# Patient Record
Sex: Female | Born: 1955 | Race: White | Hispanic: No | Marital: Married | Smoking: Never smoker
Health system: Southern US, Community
[De-identification: ages and names within clinical notes are randomized; demographics above are authoritative.]

## PROBLEM LIST (undated history)

## (undated) ENCOUNTER — Emergency Department (HOSPITAL_COMMUNITY): Admission: EM | Payer: Self-pay | Source: Home / Self Care

## (undated) DIAGNOSIS — Z8719 Personal history of other diseases of the digestive system: Secondary | ICD-10-CM

## (undated) DIAGNOSIS — E7800 Pure hypercholesterolemia, unspecified: Secondary | ICD-10-CM

## (undated) DIAGNOSIS — M1990 Unspecified osteoarthritis, unspecified site: Secondary | ICD-10-CM

## (undated) DIAGNOSIS — Z8711 Personal history of peptic ulcer disease: Secondary | ICD-10-CM

## (undated) DIAGNOSIS — I10 Essential (primary) hypertension: Secondary | ICD-10-CM

## (undated) HISTORY — DX: Personal history of peptic ulcer disease: Z87.11

## (undated) HISTORY — DX: Personal history of other diseases of the digestive system: Z87.19

## (undated) HISTORY — DX: Unspecified osteoarthritis, unspecified site: M19.90

---

## 2010-02-20 ENCOUNTER — Emergency Department (HOSPITAL_COMMUNITY): Admission: EM | Admit: 2010-02-20 | Discharge: 2010-02-20 | Payer: Self-pay | Admitting: Emergency Medicine

## 2010-03-14 ENCOUNTER — Emergency Department (HOSPITAL_COMMUNITY): Admission: EM | Admit: 2010-03-14 | Discharge: 2010-03-14 | Payer: Self-pay | Admitting: Emergency Medicine

## 2012-09-30 ENCOUNTER — Encounter (HOSPITAL_COMMUNITY): Payer: Self-pay | Admitting: Emergency Medicine

## 2012-09-30 ENCOUNTER — Emergency Department (HOSPITAL_COMMUNITY)
Admission: EM | Admit: 2012-09-30 | Discharge: 2012-09-30 | Disposition: A | Discharge status: Home / Self Care | Payer: Self-pay | Attending: Emergency Medicine | Admitting: Emergency Medicine

## 2012-09-30 DIAGNOSIS — K089 Disorder of teeth and supporting structures, unspecified: Secondary | ICD-10-CM | POA: Insufficient documentation

## 2012-09-30 DIAGNOSIS — I10 Essential (primary) hypertension: Secondary | ICD-10-CM | POA: Insufficient documentation

## 2012-09-30 DIAGNOSIS — K029 Dental caries, unspecified: Secondary | ICD-10-CM | POA: Insufficient documentation

## 2012-09-30 DIAGNOSIS — E7800 Pure hypercholesterolemia, unspecified: Secondary | ICD-10-CM | POA: Insufficient documentation

## 2012-09-30 DIAGNOSIS — K0889 Other specified disorders of teeth and supporting structures: Secondary | ICD-10-CM

## 2012-09-30 HISTORY — DX: Essential (primary) hypertension: I10

## 2012-09-30 HISTORY — DX: Pure hypercholesterolemia, unspecified: E78.00

## 2012-09-30 MED ORDER — CLINDAMYCIN HCL 150 MG PO CAPS
300.0000 mg | ORAL_CAPSULE | Freq: Once | ORAL | Status: AC
  Administered 2012-09-30: 300 mg via ORAL
  Filled 2012-09-30: qty 2

## 2012-09-30 MED ORDER — PENICILLIN V POTASSIUM 500 MG PO TABS: 500.0000 mg | ORAL_TABLET | Freq: Four times a day (QID) | ORAL | Status: AC

## 2012-09-30 NOTE — ED Notes (Signed)
Pt c/o dental pain x one month.

## 2012-09-30 NOTE — ED Provider Notes (Signed)
History     CSN: [id number]  Arrival date & time 09/30/12  0902   First MD Initiated Contact with Patient 09/30/12 [phone]      Chief Complaint  Patient presents with  . Dental Pain    (Consider location/radiation/quality/duration/timing/severity/associated sxs/prior treatment) Patient is a 56 y.o. female presenting with tooth pain. The history is provided by the patient.  Dental PainThe primary symptoms include mouth pain. Primary symptoms do not include dental injury, oral bleeding, headaches, fever, shortness of breath, sore throat, angioedema or cough. The symptoms began more than 1 week ago. The symptoms are worsening. The symptoms are chronic. The symptoms occur constantly.  Affected locations include: teeth and gum(s).  Additional symptoms include: dental sensitivity to temperature and gum tenderness. Additional symptoms do not include: gum swelling, purulent gums, trismus, facial swelling, trouble swallowing, pain with swallowing, drooling and ear pain. Medical issues include: periodontal disease. Medical issues do not include: smoking.    Past Medical History  Diagnosis Date  . Hypertension   . Hypercholesteremia     History reviewed. No pertinent past surgical history.  History reviewed. No pertinent family history.  History  Substance Use Topics  . Smoking status: Not on file  . Smokeless tobacco: Not on file  . Alcohol Use: No    OB History    Grav Para Term Preterm Abortions TAB SAB Ect Mult Living                  Review of Systems  Constitutional: Negative for fever and appetite change.  HENT: Positive for dental problem. Negative for ear pain, congestion, sore throat, facial swelling, drooling, trouble swallowing, neck pain and neck stiffness.   Eyes: Negative for pain and visual disturbance.  Respiratory: Negative for cough and shortness of breath.   Neurological: Negative for dizziness, facial asymmetry and headaches.  Hematological: Negative for  adenopathy.  All other systems reviewed and are negative.    Allergies  Review of patient's allergies indicates no known allergies.  Home Medications  No current outpatient prescriptions on file.  BP 128/82  Pulse 99  Temp 98.2 F (36.8 C) (Oral)  Resp 18  Ht 5\' 4"  (1.626 m)  Wt 158 lb (71.668 kg)  BMI 27.12 kg/m2  SpO2 95%  Physical Exam  Nursing note and vitals reviewed. Constitutional: She is oriented to person, place, and time. She appears well-developed and well-nourished. No distress.  HENT:  Head: Normocephalic and atraumatic. No trismus in the jaw.  Right Ear: Tympanic membrane and ear canal normal.  Left Ear: Tympanic membrane and ear canal normal.  Mouth/Throat: Uvula is midline, oropharynx is clear and moist and mucous membranes are normal. Dental caries present. No dental abscesses or uvula swelling.       Multiple dental caries and periodontal disease.  No trismus, facial edema or obvious abscess  Neck: Normal range of motion. Neck supple.  Cardiovascular: Normal rate, regular rhythm and normal heart sounds.   No murmur heard. Pulmonary/Chest: Effort normal and breath sounds normal.  Musculoskeletal: Normal range of motion.  Lymphadenopathy:    She has no cervical adenopathy.  Neurological: She is alert and oriented to person, place, and time. She exhibits normal muscle tone. Coordination normal.  Skin: Skin is warm and dry.    ED Course  Procedures (including critical care time)  Labs Reviewed - No data to display No results found.      MDM   Multiple dental caries and widespread dental disease.  No facial  edema or trismus.      Pt agrees to f/u with dentist.  Prescribed: Pen VK   Ellayna Hilligoss L. Emberlee Sortino, [place] 10/02/12 1544

## 2012-10-02 NOTE — ED Provider Notes (Signed)
Medical screening examination/treatment/procedure(s) were performed by non-physician practitioner and as supervising physician I was immediately available for consultation/collaboration.  Laurann Mcmorris, MD 10/02/12 1549 

## 2013-12-13 ENCOUNTER — Encounter (HOSPITAL_COMMUNITY): Payer: Self-pay | Admitting: Emergency Medicine

## 2013-12-13 ENCOUNTER — Emergency Department (HOSPITAL_COMMUNITY)
Admission: EM | Admit: 2013-12-13 | Discharge: 2013-12-13 | Disposition: A | Discharge status: Home / Self Care | Payer: Self-pay | Attending: Emergency Medicine | Admitting: Emergency Medicine

## 2013-12-13 DIAGNOSIS — I10 Essential (primary) hypertension: Secondary | ICD-10-CM | POA: Insufficient documentation

## 2013-12-13 DIAGNOSIS — Z862 Personal history of diseases of the blood and blood-forming organs and certain disorders involving the immune mechanism: Secondary | ICD-10-CM | POA: Insufficient documentation

## 2013-12-13 DIAGNOSIS — K047 Periapical abscess without sinus: Secondary | ICD-10-CM

## 2013-12-13 DIAGNOSIS — K029 Dental caries, unspecified: Secondary | ICD-10-CM

## 2013-12-13 DIAGNOSIS — Z8639 Personal history of other endocrine, nutritional and metabolic disease: Secondary | ICD-10-CM | POA: Insufficient documentation

## 2013-12-13 MED ORDER — AMOXICILLIN 500 MG PO CAPS: 500.0000 mg | ORAL_CAPSULE | Freq: Three times a day (TID) | ORAL | Status: AC

## 2013-12-13 NOTE — ED Provider Notes (Signed)
CSN: [id number]     Arrival date & time 12/13/13  1243 History   First MD Initiated Contact with Patient 12/13/13 1246     Chief Complaint  Patient presents with  . Dental Pain   (Consider location/radiation/quality/duration/timing/severity/associated sxs/prior Treatment) HPI Comments: Jessica Russo is a 58 y.o. female presenting with a several day history of dental pain and gingival swelling.   The patient has a history of decay in several of her left lower molar teeth which has recently started to cause increased pain and now swelling along the outer gingiva.  There has been no fevers,  Chills, nausea or vomiting, also no complaint of difficulty swallowing,  Although chewing makes pain worse.  The patient has tried tylenol and motrin without relief of symptoms.  She is actively waiting for an appointment at [facility] for definitive tx of her dental problems,  Registered 4 weeks ago,  Was told there is a 12-16 week wait time to be seen.    The history is provided by the patient.    Past Medical History  Diagnosis Date  . Hypertension   . Hypercholesteremia    History reviewed. No pertinent past surgical history. History reviewed. No pertinent family history. History  Substance Use Topics  . Smoking status: Never Smoker   . Smokeless tobacco: Not on file  . Alcohol Use: No   OB History   Grav Para Term Preterm Abortions TAB SAB Ect Mult Living                 Review of Systems  Constitutional: Negative for fever.  HENT: Positive for dental problem. Negative for facial swelling and sore throat.   Respiratory: Negative for shortness of breath.   Musculoskeletal: Negative for neck pain and neck stiffness.    Allergies  Review of patient's allergies indicates no known allergies.  Home Medications   Current Outpatient Rx  Name  Route  Sig  Dispense  Refill  . amoxicillin (AMOXIL) 500 MG capsule   Oral   Take 1 capsule (500 mg total) by mouth 3 (three) times  daily.   30 capsule   0    BP 100/78  Pulse 94  Temp(Src) 98.2 F (36.8 C) (Oral)  Resp 20  Ht 5\' 4"  (1.626 m)  Wt 150 lb (68.04 kg)  BMI 25.73 kg/m2  SpO2 98% Physical Exam  Constitutional: She is oriented to person, place, and time. She appears well-developed and well-nourished. No distress.  HENT:  Head: Normocephalic and atraumatic.  Right Ear: Tympanic membrane and external ear normal.  Left Ear: Tympanic membrane and external ear normal.  Mouth/Throat: Oropharynx is clear and moist and mucous membranes are normal. No oral lesions. No trismus in the jaw. Dental abscesses present.    Deep cavities in #18 and #20.  Erythema along outer gingival along the left mandible.  No fluctuance, no drainage, no facial edema or erythema.  Eyes: Conjunctivae are normal.  Neck: Normal range of motion. Neck supple.  Cardiovascular: Normal rate and normal heart sounds.   Pulmonary/Chest: Effort normal.  Abdominal: She exhibits no distension.  Musculoskeletal: Normal range of motion.  Lymphadenopathy:    She has no cervical adenopathy.  Neurological: She is alert and oriented to person, place, and time.  Skin: Skin is warm and dry. No erythema.  Psychiatric: She has a normal mood and affect.    ED Course  Procedures (including critical care time) Labs Review Labs Reviewed - No data to display  Imaging Review No results found.  EKG Interpretation   None       MDM   1. Dental abscess   2. Dental decay    Amoxil prescribed.  Suggested dental [facility] which she is planning [facility] at [facility]  In the interim,  Encouraged recheck by pcp [facility]) for any worsened sx.    Burgess AmorJulie Thu Baggett, PA-C 12/13/13 1438

## 2013-12-13 NOTE — Discharge Instructions (Signed)
°  Dental Abscess °A dental abscess is a collection of infected fluid (pus) from a bacterial infection in the inner part of the tooth (pulp). It usually occurs at the end of the tooth's root.  °CAUSES  °· Severe tooth decay. °· Trauma to the tooth that allows bacteria to enter into the pulp, such as a broken or chipped tooth. °SYMPTOMS  °· Severe pain in and around the infected tooth. °· Swelling and redness around the abscessed tooth or in the mouth or face. °· Tenderness. °· Pus drainage. °· Bad breath. °· Bitter taste in the mouth. °· Difficulty swallowing. °· Difficulty opening the mouth. °· Nausea. °· Vomiting. °· Chills. °· Swollen neck glands. °DIAGNOSIS  °· A medical and dental history will be taken. °· An examination will be performed by tapping on the abscessed tooth. °· X-rays may be taken of the tooth to identify the abscess. °TREATMENT °The goal of treatment is to eliminate the infection. You may be prescribed antibiotic medicine to stop the infection from spreading. A root canal may be performed to save the tooth. If the tooth cannot be saved, it may be pulled (extracted) and the abscess may be drained.  °HOME CARE INSTRUCTIONS °· Only take over-the-counter or prescription medicines for pain, fever, or discomfort as directed by your caregiver. °· Rinse your mouth (gargle) often with salt water (¼ tsp salt in 8 oz [250 ml] of warm water) to relieve pain or swelling. °· Do not drive after taking pain medicine (narcotics). °· Do not apply heat to the outside of your face. °· Return to your dentist for further treatment as directed. °SEEK MEDICAL CARE IF: °· Your pain is not helped by medicine. °· Your pain is getting worse instead of better. °SEEK IMMEDIATE MEDICAL CARE IF: °· You have a fever or persistent symptoms for more than 2 3 days. °· You have a fever and your symptoms suddenly get worse. °· You have chills or a very bad headache. °· You have problems breathing or swallowing. °· You have trouble  opening your mouth. °· You have swelling in the neck or around the eye. °Document Released: 10/31/2005 Document Revised: 07/25/2012 Document Reviewed: 02/08/2011 °Exit[employer]® Patient Information ©2014 [employer]. ° ° °

## 2013-12-13 NOTE — ED Provider Notes (Signed)
Medical screening examination/treatment/procedure(s) were performed by non-physician practitioner and as supervising physician I was immediately available for consultation/collaboration.     Garo Heidelberg, MD 12/13/13 1446 

## 2013-12-13 NOTE — ED Notes (Signed)
Dental pain lt lower jaw.

## 2014-10-23 ENCOUNTER — Encounter (HOSPITAL_COMMUNITY): Payer: Self-pay | Admitting: Emergency Medicine

## 2014-10-23 ENCOUNTER — Emergency Department (HOSPITAL_COMMUNITY): Payer: Self-pay

## 2014-10-23 ENCOUNTER — Emergency Department (HOSPITAL_COMMUNITY)
Admission: EM | Admit: 2014-10-23 | Discharge: 2014-10-23 | Disposition: A | Discharge status: Home / Self Care | Payer: Self-pay | Attending: Emergency Medicine | Admitting: Emergency Medicine

## 2014-10-23 DIAGNOSIS — W1839XA Other fall on same level, initial encounter: Secondary | ICD-10-CM | POA: Insufficient documentation

## 2014-10-23 DIAGNOSIS — S93401A Sprain of unspecified ligament of right ankle, initial encounter: Secondary | ICD-10-CM | POA: Insufficient documentation

## 2014-10-23 DIAGNOSIS — R609 Edema, unspecified: Secondary | ICD-10-CM

## 2014-10-23 DIAGNOSIS — Y939 Activity, unspecified: Secondary | ICD-10-CM | POA: Insufficient documentation

## 2014-10-23 DIAGNOSIS — Z8639 Personal history of other endocrine, nutritional and metabolic disease: Secondary | ICD-10-CM | POA: Insufficient documentation

## 2014-10-23 DIAGNOSIS — I10 Essential (primary) hypertension: Secondary | ICD-10-CM | POA: Insufficient documentation

## 2014-10-23 DIAGNOSIS — Y998 Other external cause status: Secondary | ICD-10-CM | POA: Insufficient documentation

## 2014-10-23 DIAGNOSIS — Y929 Unspecified place or not applicable: Secondary | ICD-10-CM | POA: Insufficient documentation

## 2014-10-23 NOTE — Care Management Note (Signed)
ED/CM noted patient did not have health insurance and/or PCP listed in the computer.  Patient was given the [place] resource handout with information on the clinics, food pantries, and the handout for new health insurance sign-up. Pt was also given a Rx discount card. Patient expressed appreciation for information received. 

## 2014-10-23 NOTE — Discharge Instructions (Signed)

## 2014-10-23 NOTE — ED Notes (Signed)
Pt c/o right ankle pain after falling x 2 days ago. Swelling/bruising noted.

## 2014-10-23 NOTE — ED Provider Notes (Signed)
CSN: [id number]     Arrival date & time 10/23/14  1158 History   First MD Initiated Contact with Patient 10/23/14 1309     Chief Complaint  Patient presents with  . Ankle Pain     (Consider location/radiation/quality/duration/timing/severity/associated sxs/prior Treatment) Patient is a 58 y.o. female presenting with ankle pain. The history is provided by the patient.  Ankle Pain Location:  Ankle Time since incident:  2 days Ankle location:  R ankle Pain details:    Quality:  Aching   Severity:  Moderate   Onset quality:  Sudden   Duration:  2 days   Timing:  Intermittent   Progression:  Worsening Chronicity:  New Dislocation: no   Relieved by:  Nothing Worsened by:  Bearing weight Associated symptoms: decreased ROM and swelling   Associated symptoms: no back pain, no neck pain and no numbness   Risk factors: no frequent fractures and no recent illness     Past Medical History  Diagnosis Date  . Hypertension   . Hypercholesteremia    History reviewed. No pertinent past surgical history. No family history on file. History  Substance Use Topics  . Smoking status: Never Smoker   . Smokeless tobacco: Not on file  . Alcohol Use: No   OB History    No data available     Review of Systems  Constitutional: Negative for activity change.       All ROS Neg except as noted in HPI  HENT: Negative for nosebleeds.   Eyes: Negative for photophobia and discharge.  Respiratory: Negative for cough, shortness of breath and wheezing.   Cardiovascular: Negative for chest pain and palpitations.  Gastrointestinal: Negative for abdominal pain and blood in stool.  Genitourinary: Negative for dysuria, frequency and hematuria.  Musculoskeletal: Negative for back pain, arthralgias and neck pain.  Skin: Negative.   Neurological: Negative for dizziness, seizures and speech difficulty.  Psychiatric/Behavioral: Negative for hallucinations and confusion.      Allergies  Review of  patient's allergies indicates no known allergies.  Home Medications   Prior to Admission medications   Not on File   BP 123/76 mmHg  Pulse 84  Temp(Src) 98.7 F (37.1 C)  Resp 18  Ht 5\' 4"  (1.626 m)  Wt 150 lb (68.04 kg)  BMI 25.73 kg/m2  SpO2 98% Physical Exam  Constitutional: She is oriented to person, place, and time. She appears well-developed and well-nourished.  Non-toxic appearance.  HENT:  Head: Normocephalic.  Right Ear: Tympanic membrane and external ear normal.  Left Ear: Tympanic membrane and external ear normal.  Eyes: EOM and lids are normal. Pupils are equal, round, and reactive to light.  Neck: Normal range of motion. Neck supple. Carotid bruit is not present.  Cardiovascular: Normal rate, regular rhythm, normal heart sounds, intact distal pulses and normal pulses.   Pulmonary/Chest: Breath sounds normal. No respiratory distress.  Abdominal: Soft. Bowel sounds are normal. There is no tenderness. There is no guarding.  Musculoskeletal: Normal range of motion.  Is full range of motion of the toes of the right lower extremity. There is a hematoma noted of the lateral malleolus. Dorsalis pedis and posterior tibial pulses are 2+. Is good range of motion of the right knee and hip.  Lymphadenopathy:       Head (right side): No submandibular adenopathy present.       Head (left side): No submandibular adenopathy present.    She has no cervical adenopathy.  Neurological: She is alert and  oriented to person, place, and time. She has normal strength. No cranial nerve deficit or sensory deficit.  Skin: Skin is warm and dry.  Psychiatric: She has a normal mood and affect. Her speech is normal.  Nursing note and vitals reviewed.   ED Course  Procedures (including critical care time) Labs Review Labs Reviewed - No data to display  Imaging Review Dg Ankle Complete Right  10/23/2014   CLINICAL DATA:  Right ankle pain.  EXAM: RIGHT ANKLE - COMPLETE 3+ VIEW  COMPARISON:   None.  FINDINGS: Mild soft tissue swelling. No acute bony or joint abnormality. No evidence fracture or dislocation.  IMPRESSION: Mild soft tissue swelling.  No acute abnormality.   Electronically Signed   By: Maisie Fushomas  Register   On: 10/23/2014 13:31     EKG Interpretation None      MDM  Vital signs are well within normal limits. X-ray of the right ankle show soft tissue swelling, but no fracture or dislocation. The patient is fitted with an Aircast splint. The patient is to follow-up with orthopedics if not improving.    Final diagnoses:  Swelling    *I have reviewed nursing notes, vital signs, and all appropriate lab and imaging results for this patient.[address]    Dorlisa Savino M Bradan Congrove, PA-C 10/23/14 1353  Donnetta HutchingBrian Cook, MD 10/25/14 [phone]

## 2015-01-10 ENCOUNTER — Emergency Department (HOSPITAL_COMMUNITY)
Admission: EM | Admit: 2015-01-10 | Discharge: 2015-01-11 | Disposition: A | Discharge status: Home / Self Care | Payer: Self-pay | Attending: Emergency Medicine | Admitting: Emergency Medicine

## 2015-01-10 ENCOUNTER — Encounter (HOSPITAL_COMMUNITY): Payer: Self-pay | Admitting: *Deleted

## 2015-01-10 ENCOUNTER — Emergency Department (HOSPITAL_COMMUNITY): Payer: Self-pay

## 2015-01-10 DIAGNOSIS — Y9289 Other specified places as the place of occurrence of the external cause: Secondary | ICD-10-CM | POA: Insufficient documentation

## 2015-01-10 DIAGNOSIS — E780 Pure hypercholesterolemia: Secondary | ICD-10-CM | POA: Insufficient documentation

## 2015-01-10 DIAGNOSIS — W01198A Fall on same level from slipping, tripping and stumbling with subsequent striking against other object, initial encounter: Secondary | ICD-10-CM | POA: Insufficient documentation

## 2015-01-10 DIAGNOSIS — Y998 Other external cause status: Secondary | ICD-10-CM | POA: Insufficient documentation

## 2015-01-10 DIAGNOSIS — I10 Essential (primary) hypertension: Secondary | ICD-10-CM | POA: Insufficient documentation

## 2015-01-10 DIAGNOSIS — Y9301 Activity, walking, marching and hiking: Secondary | ICD-10-CM | POA: Insufficient documentation

## 2015-01-10 DIAGNOSIS — S0012XA Contusion of left eyelid and periocular area, initial encounter: Secondary | ICD-10-CM | POA: Insufficient documentation

## 2015-01-10 DIAGNOSIS — W010XXA Fall on same level from slipping, tripping and stumbling without subsequent striking against object, initial encounter: Secondary | ICD-10-CM

## 2015-01-10 DIAGNOSIS — S0083XA Contusion of other part of head, initial encounter: Secondary | ICD-10-CM

## 2015-01-10 MED ORDER — IBUPROFEN 800 MG PO TABS
800.0000 mg | ORAL_TABLET | Freq: Once | ORAL | Status: AC
  Administered 2015-01-11: 800 mg via ORAL
  Filled 2015-01-10: qty 1

## 2015-01-10 NOTE — ED Notes (Signed)
Larey SeatFell over railroad tie hitting L eye on edge of box and railroad tie.

## 2015-01-10 NOTE — ED Provider Notes (Signed)
CSN: [id number]     Arrival date & time 01/10/15  2144 [id number]  This chart was scribed for Dione Boozeavid Wessie Shanks, MD by [place] Peace, ED Scribe. The patient was seen in APA14/AP[facility]14. The patient's care was started at 11:00 PM.    Chief Complaint  Patient presents with  . Fall  . Eye Pain      Patient is a 59 y.o. female presenting with fall and eye pain. The history is provided by the patient. No language interpreter was used.  Fall This is a new problem. The current episode started 1 to 2 hours ago.  Eye Pain    HPI Comments: Jessica Russo is a 59 y.o. female who presents to the Emergency Department complaining of fall that occurred earlier tonight while pt was walking to her car. Pt states that Jessica Russo was carrying a box in her hands when Jessica Russo tripped and hit her face on the corner of the box. Pt now complains of left eye pain with bruising noted. Pt has applied ice to hematoma that Jessica Russo states was formed upon onset of incident which has help reduce swelling. No complaints of LOC, nausea, weakness, numbness, or dizziness.    Past Medical History  Diagnosis Date  . Hypertension   . Hypercholesteremia    History reviewed. No pertinent past surgical history. History reviewed. No pertinent family history. History  Substance Use Topics  . Smoking status: Never Smoker   . Smokeless tobacco: Not on file  . Alcohol Use: No   OB History    No data available     Review of Systems  Eyes: Positive for pain.  Gastrointestinal: Negative for nausea.  Neurological: Negative for dizziness, syncope, weakness and numbness.      Allergies  Review of patient's allergies indicates no known allergies.  Home Medications   Prior to Admission medications   Medication Sig Start Date End Date Taking? Authorizing Provider  lisinopril (PRINIVIL,ZESTRIL) 2.5 MG tablet Take 2.5 mg by mouth daily.   Yes Historical Provider, MD  lovastatin (MEVACOR) 10 MG tablet Take 10 mg by mouth at bedtime.   Yes Historical  Provider, MD   BP 145/82 mmHg  Pulse 85  Temp(Src) 98.1 F (36.7 C) (Oral)  Resp 14  Ht 5\' 3"  (1.6 m)  Wt 155 lb (70.308 kg)  BMI 27.46 kg/m2  SpO2 100% Physical Exam  Constitutional: Jessica Russo is oriented to person, place, and time. Jessica Russo appears well-developed and well-nourished.  HENT:  Head: Normocephalic and atraumatic.  Swelling and tenderness in left malar area. Left periorbital ecchymosis.   Eyes: Conjunctivae and EOM are normal. Pupils are equal, round, and reactive to light.  Neck: No JVD present. Erythema: Non-tender.  Cardiovascular: Normal rate, regular rhythm and normal heart sounds.   No murmur heard. Pulmonary/Chest: Effort normal and breath sounds normal. Jessica Russo has no wheezes. Jessica Russo has no rales. Jessica Russo exhibits no tenderness.  Abdominal: Soft. Bowel sounds are normal. Jessica Russo exhibits no distension and no mass. There is no tenderness.  Musculoskeletal: Normal range of motion. Jessica Russo exhibits no edema.  Lymphadenopathy:    Jessica Russo has no cervical adenopathy.  Neurological: Jessica Russo is alert and oriented to person, place, and time. No cranial nerve deficit. Coordination normal.  Skin: Skin is warm and dry. No rash noted.  Psychiatric: Jessica Russo has a normal mood and affect. Her behavior is normal. Judgment and thought content normal.  Nursing note and vitals reviewed.   ED Course  Procedures (including critical care time)  Imaging Review Ct  Cervical Spine Wo Contrast  01/11/2015   CLINICAL DATA:  Fifth fall hitting left side of face onto a box. Sharp pain and bruising to the left eye. Bilateral neck stiffness. Fall from slipped trip or stumble, initial encounter  EXAM: CT MAXILLOFACIAL WITHOUT CONTRAST  CT CERVICAL SPINE WITHOUT CONTRAST  TECHNIQUE: Multidetector CT imaging of the cervical spine and maxillofacial structures were performed using the standard protocol without intravenous contrast. Multiplanar CT image reconstructions of the cervical spine and maxillofacial structures were also  generated.  COMPARISON:  None.  FINDINGS: CT MAXILLOFACIAL FINDINGS  The globes and extraocular muscles appear intact and symmetrical. Normal subcutaneous soft tissue hematoma over the left maxilla inferior to the left eye. The paranasal sinuses are clear. Mastoid air cells are clear. Orbital and nasal bones, facial bones, mandibles, temporomandibular joints, zygomatic arches, and pterygoid plates appear intact. No displaced fractures identified. Multiple tooth extractions. Multiple dental caries. Periapical lucencies at multiple teeth consistent with periodontal disease.  CT CERVICAL SPINE FINDINGS  There is reversal of the usual cervical lordosis with slight anterior subluxation of C3 on C4. This may be due to patient positioning or be related to degenerative change. However, muscle spasm and ligamentous injury can also have this appearance and are not excluded. Diffuse degenerative change throughout the cervical spine with narrowed cervical interspaces and associated endplate hypertrophic changes. Normal alignment of the cervical facet joints with degenerative changes present. C1-2 articulation appears intact with degenerative changes present. No vertebral compression deformities. No prevertebral soft tissue swelling. No focal bone lesion or bone destruction. Bone cortex and trabecular architecture appear intact.  IMPRESSION: No acute displaced orbital or facial fractures identified. Poor dentition.  Degenerative changes in the cervical spine. Nonspecific reversal of the usual cervical lordosis. No acute displaced fractures identified.   Electronically Signed   By: Burman Nieves M.D.   On: 01/11/2015 00:23   Ct Maxillofacial Wo Cm  01/11/2015   CLINICAL DATA:  Fifth fall hitting left side of face onto a box. Sharp pain and bruising to the left eye. Bilateral neck stiffness. Fall from slipped trip or stumble, initial encounter  EXAM: CT MAXILLOFACIAL WITHOUT CONTRAST  CT CERVICAL SPINE WITHOUT CONTRAST   TECHNIQUE: Multidetector CT imaging of the cervical spine and maxillofacial structures were performed using the standard protocol without intravenous contrast. Multiplanar CT image reconstructions of the cervical spine and maxillofacial structures were also generated.  COMPARISON:  None.  FINDINGS: CT MAXILLOFACIAL FINDINGS  The globes and extraocular muscles appear intact and symmetrical. Normal subcutaneous soft tissue hematoma over the left maxilla inferior to the left eye. The paranasal sinuses are clear. Mastoid air cells are clear. Orbital and nasal bones, facial bones, mandibles, temporomandibular joints, zygomatic arches, and pterygoid plates appear intact. No displaced fractures identified. Multiple tooth extractions. Multiple dental caries. Periapical lucencies at multiple teeth consistent with periodontal disease.  CT CERVICAL SPINE FINDINGS  There is reversal of the usual cervical lordosis with slight anterior subluxation of C3 on C4. This may be due to patient positioning or be related to degenerative change. However, muscle spasm and ligamentous injury can also have this appearance and are not excluded. Diffuse degenerative change throughout the cervical spine with narrowed cervical interspaces and associated endplate hypertrophic changes. Normal alignment of the cervical facet joints with degenerative changes present. C1-2 articulation appears intact with degenerative changes present. No vertebral compression deformities. No prevertebral soft tissue swelling. No focal bone lesion or bone destruction. Bone cortex and trabecular architecture appear intact.  IMPRESSION: No  acute displaced orbital or facial fractures identified. Poor dentition.  Degenerative changes in the cervical spine. Nonspecific reversal of the usual cervical lordosis. No acute displaced fractures identified.   Electronically Signed   By: Burman Nieves M.D.   On: 01/11/2015 00:23   Images viewed by me.  Medications  traMADol  (ULTRAM) tablet 50 mg (not administered)  ibuprofen (ADVIL,MOTRIN) tablet 800 mg (800 mg Oral Given 01/11/15 0009)    11:03 PM- Treatment plan was discussed with patient who verbalizes understanding and agrees.   MDM   Final diagnoses:  Fall from slip, trip, or stumble, initial encounter  Facial contusion, initial encounter    Fall with facial injury. No evidence of neurologic injury. Jessica Russo is sent for CT scan of maxillofacial and cervical spine with no acute injury identified. Jessica Russo is discharged with prescription for tramadol.  I personally performed the services described in this documentation, which was scribed in my presence. The recorded information has been reviewed and is accurate.      Dione Booze, MD 01/11/15 [phone]

## 2015-01-11 MED ORDER — TRAMADOL HCL 50 MG PO TABS: 50.0000 mg | ORAL_TABLET | Freq: Once | ORAL | Status: DC

## 2015-01-11 MED ORDER — TRAMADOL HCL 50 MG PO TABS: 50.0000 mg | ORAL_TABLET | Freq: Four times a day (QID) | ORAL | Status: DC | PRN

## 2015-01-11 NOTE — Discharge Instructions (Signed)
Take acetaminophen and/or ibuprofen as needed for less severe pain.  Contusion A contusion is a deep bruise. Contusions are the result of an injury that caused bleeding under the skin. The contusion may turn blue, purple, or yellow. Minor injuries will give you a painless contusion, but more severe contusions may stay painful and swollen for a few weeks.  CAUSES  A contusion is usually caused by a blow, trauma, or direct force to an area of the body. SYMPTOMS   Swelling and redness of the injured area.  Bruising of the injured area.  Tenderness and soreness of the injured area.  Pain. DIAGNOSIS  The diagnosis can be made by taking a history and physical exam. An X-ray, CT scan, or MRI may be needed to determine if there were any associated injuries, such as fractures. TREATMENT  Specific treatment will depend on what area of the body was injured. In general, the best treatment for a contusion is resting, icing, elevating, and applying cold compresses to the injured area. Over-the-counter medicines may also be recommended for pain control. Ask your caregiver what the best treatment is for your contusion. HOME CARE INSTRUCTIONS   Put ice on the injured area.  Put ice in a plastic bag.  Place a towel between your skin and the bag.  Leave the ice on for 15-20 minutes, 3-4 times a day, or as directed by your health care provider.  Only take over-the-counter or prescription medicines for pain, discomfort, or fever as directed by your caregiver. Your caregiver may recommend avoiding anti-inflammatory medicines (aspirin, ibuprofen, and naproxen) for 48 hours because these medicines may increase bruising.  Rest the injured area.  If possible, elevate the injured area to reduce swelling. SEEK IMMEDIATE MEDICAL CARE IF:   You have increased bruising or swelling.  You have pain that is getting worse.  Your swelling or pain is not relieved with medicines. MAKE SURE YOU:   Understand  these instructions.  Will watch your condition.  Will get help right away if you are not doing well or get worse. Document Released: 08/10/2005 Document Revised: 11/05/2013 Document Reviewed: 09/05/2011 [facility] Patient Information 2015 [place], [place]. This information is not intended to replace advice given to you by your health care provider. Make sure you discuss any questions you have with your health care provider.  Tramadol tablets What is this medicine? TRAMADOL (TRA ma dole) is a pain reliever. It is used to treat moderate to severe pain in adults. This medicine may be used for other purposes; ask your health care provider or pharmacist if you have questions. COMMON BRAND NAME(S): Ultram What should I tell my health care provider before I take this medicine? They need to know if you have any of these conditions: -brain tumor -depression -drug abuse or addiction -head injury -if you frequently drink alcohol containing drinks -kidney disease or trouble passing urine -liver disease -lung disease, asthma, or breathing problems -seizures or epilepsy -suicidal thoughts, plans, or attempt; a previous suicide attempt by you or a family member -an unusual or allergic reaction to tramadol, codeine, other medicines, foods, dyes, or preservatives -pregnant or trying to get pregnant -breast-feeding How should I use this medicine? Take this medicine by mouth with a full glass of water. Follow the directions on the prescription label. If the medicine upsets your stomach, take it with food or milk. Do not take more medicine than you are told to take. Talk to your pediatrician regarding the use of this medicine in children.  Special care may be needed. Overdosage: If you think you have taken too much of this medicine contact a poison control center or emergency room at once. NOTE: This medicine is only for you. Do not share this medicine with others. What if I miss a dose? If you miss a  dose, take it as soon as you can. If it is almost time for your next dose, take only that dose. Do not take double or extra doses. What may interact with this medicine? Do not take this medicine with any of the following medications: -MAOIs like Carbex, Eldepryl, Marplan, Nardil, and Parnate This medicine may also interact with the following medications: -alcohol or medicines that contain alcohol -antihistamines -benzodiazepines -bupropion -carbamazepine or oxcarbazepine -clozapine -cyclobenzaprine -digoxin -furazolidone -linezolid -medicines for depression, anxiety, or psychotic disturbances -medicines for migraine headache like almotriptan, eletriptan, frovatriptan, naratriptan, rizatriptan, sumatriptan, zolmitriptan -medicines for pain like pentazocine, buprenorphine, butorphanol, meperidine, nalbuphine, and propoxyphene -medicines for sleep -muscle relaxants -naltrexone -phenobarbital -phenothiazines like perphenazine, thioridazine, chlorpromazine, mesoridazine, fluphenazine, prochlorperazine, promazine, and trifluoperazine -procarbazine -warfarin This list may not describe all possible interactions. Give your health care provider a list of all the medicines, herbs, non-prescription drugs, or dietary supplements you use. Also tell them if you smoke, drink alcohol, or use illegal drugs. Some items may interact with your medicine. What should I watch for while using this medicine? Tell your doctor or health care professional if your pain does not go away, if it gets worse, or if you have new or a different type of pain. You may develop tolerance to the medicine. Tolerance means that you will need a higher dose of the medicine for pain relief. Tolerance is normal and is expected if you take this medicine for a long time. Do not suddenly stop taking your medicine because you may develop a severe reaction. Your body becomes used to the medicine. This does NOT mean you are addicted.  Addiction is a behavior related to getting and using a drug for a non-medical reason. If you have pain, you have a medical reason to take pain medicine. Your doctor will tell you how much medicine to take. If your doctor wants you to stop the medicine, the dose will be slowly lowered over time to avoid any side effects. You may get drowsy or dizzy. Do not drive, use machinery, or do anything that needs mental alertness until you know how this medicine affects you. Do not stand or sit up quickly, especially if you are an older patient. This reduces the risk of dizzy or fainting spells. Alcohol can increase or decrease the effects of this medicine. Avoid alcoholic drinks. You may have constipation. Try to have a bowel movement at least every 2 to 3 days. If you do not have a bowel movement for 3 days, call your doctor or health care professional. Your mouth may get dry. Chewing sugarless gum or sucking hard candy, and drinking plenty of water may help. Contact your doctor if the problem does not go away or is severe. What side effects may I notice from receiving this medicine? Side effects that you should report to your doctor or health care professional as soon as possible: -allergic reactions like skin rash, itching or hives, swelling of the face, lips, or tongue -breathing difficulties, wheezing -confusion -itching -light headedness or fainting spells -redness, blistering, peeling or loosening of the skin, including inside the mouth -seizures Side effects that usually do not require medical attention (report to your doctor or health  care professional if they continue or are bothersome): -constipation -dizziness -drowsiness -headache -nausea, vomiting This list may not describe all possible side effects. Call your doctor for medical advice about side effects. You may report side effects to FDA at 1-800-FDA-1088. Where should I keep my medicine? Keep out of the reach of children. Store at room  temperature between 15 and 30 degrees C (59 and 86 degrees F). Keep container tightly closed. Throw away any unused medicine after the expiration date. NOTE: This sheet is a summary. It may not cover all possible information. If you have questions about this medicine, talk to your doctor, pharmacist, or health care provider.  2015, [employer]vier/[employer] Standard. (2010-07-14 11:55:44)

## 2015-02-06 ENCOUNTER — Emergency Department (HOSPITAL_COMMUNITY): Payer: Self-pay

## 2015-02-06 ENCOUNTER — Emergency Department (HOSPITAL_COMMUNITY)
Admission: EM | Admit: 2015-02-06 | Discharge: 2015-02-06 | Disposition: A | Discharge status: Home / Self Care | Payer: Self-pay | Attending: Emergency Medicine | Admitting: Emergency Medicine

## 2015-02-06 ENCOUNTER — Encounter (HOSPITAL_COMMUNITY): Payer: Self-pay | Admitting: *Deleted

## 2015-02-06 DIAGNOSIS — R109 Unspecified abdominal pain: Secondary | ICD-10-CM

## 2015-02-06 DIAGNOSIS — R1032 Left lower quadrant pain: Secondary | ICD-10-CM | POA: Insufficient documentation

## 2015-02-06 DIAGNOSIS — I10 Essential (primary) hypertension: Secondary | ICD-10-CM | POA: Insufficient documentation

## 2015-02-06 DIAGNOSIS — R52 Pain, unspecified: Secondary | ICD-10-CM

## 2015-02-06 DIAGNOSIS — Z79899 Other long term (current) drug therapy: Secondary | ICD-10-CM | POA: Insufficient documentation

## 2015-02-06 DIAGNOSIS — R0602 Shortness of breath: Secondary | ICD-10-CM | POA: Insufficient documentation

## 2015-02-06 DIAGNOSIS — E780 Pure hypercholesterolemia: Secondary | ICD-10-CM | POA: Insufficient documentation

## 2015-02-06 LAB — URINALYSIS, ROUTINE W REFLEX MICROSCOPIC
Bilirubin Urine: NEGATIVE
Glucose, UA: NEGATIVE mg/dL
Hgb urine dipstick: NEGATIVE
Ketones, ur: NEGATIVE mg/dL
LEUKOCYTES UA: NEGATIVE
Nitrite: NEGATIVE
PROTEIN: NEGATIVE mg/dL
Specific Gravity, Urine: 1.01 (ref 1.005–1.030)
UROBILINOGEN UA: 0.2 mg/dL (ref 0.0–1.0)
pH: 6 (ref 5.0–8.0)

## 2015-02-06 LAB — CBC WITH DIFFERENTIAL/PLATELET
BASOS ABS: 0 10*3/uL (ref 0.0–0.1)
Basophils Relative: 1 % (ref 0–1)
EOS PCT: 3 % (ref 0–5)
Eosinophils Absolute: 0.2 10*3/uL (ref 0.0–0.7)
HEMATOCRIT: 40.3 % (ref 36.0–46.0)
Hemoglobin: 12.8 g/dL (ref 12.0–15.0)
LYMPHS ABS: 1.2 10*3/uL (ref 0.7–4.0)
LYMPHS PCT: 18 % (ref 12–46)
MCH: 25.4 pg — ABNORMAL LOW (ref 26.0–34.0)
MCHC: 31.8 g/dL (ref 30.0–36.0)
MCV: 80.1 fL (ref 78.0–100.0)
MONO ABS: 0.7 10*3/uL (ref 0.1–1.0)
Monocytes Relative: 10 % (ref 3–12)
NEUTROS ABS: 4.6 10*3/uL (ref 1.7–7.7)
Neutrophils Relative %: 68 % (ref 43–77)
Platelets: 332 10*3/uL (ref 150–400)
RBC: 5.03 MIL/uL (ref 3.87–5.11)
RDW: 14.2 % (ref 11.5–15.5)
WBC: 6.6 10*3/uL (ref 4.0–10.5)

## 2015-02-06 LAB — COMPREHENSIVE METABOLIC PANEL
ALBUMIN: 3.9 g/dL (ref 3.5–5.2)
ALT: 23 U/L (ref 0–35)
ANION GAP: 9 (ref 5–15)
AST: 25 U/L (ref 0–37)
Alkaline Phosphatase: 60 U/L (ref 39–117)
BILIRUBIN TOTAL: 0.2 mg/dL — AB (ref 0.3–1.2)
BUN: 11 mg/dL (ref 6–23)
CHLORIDE: 103 mmol/L (ref 96–112)
CO2: 28 mmol/L (ref 19–32)
Calcium: 9.3 mg/dL (ref 8.4–10.5)
Creatinine, Ser: 0.57 mg/dL (ref 0.50–1.10)
GFR calc non Af Amer: >90 mL/min (ref >90)
Glucose, Bld: 96 mg/dL (ref 70–99)
POTASSIUM: 3.8 mmol/L (ref 3.5–5.1)
Sodium: 140 mmol/L (ref 135–145)
TOTAL PROTEIN: 7.2 g/dL (ref 6.0–8.3)

## 2015-02-06 LAB — D-DIMER, QUANTITATIVE (NOT AT ARMC): D DIMER QUANT: 0.77 ug{FEU}/mL — AB (ref 0.00–0.48)

## 2015-02-06 MED ORDER — TRAMADOL HCL 50 MG PO TABS: 50.0000 mg | ORAL_TABLET | Freq: Four times a day (QID) | ORAL | Status: DC | PRN

## 2015-02-06 MED ORDER — IOHEXOL 350 MG/ML SOLN
100.0000 mL | Freq: Once | INTRAVENOUS | Status: AC | PRN
  Administered 2015-02-06: 100 mL via INTRAVENOUS

## 2015-02-06 MED ORDER — IBUPROFEN 800 MG PO TABS
800.0000 mg | ORAL_TABLET | Freq: Once | ORAL | Status: AC
  Administered 2015-02-06: 800 mg via ORAL
  Filled 2015-02-06: qty 1

## 2015-02-06 NOTE — Discharge Instructions (Signed)
Take the ultram if ibuprofen does not help with the discomfort and follow up next week not improving.

## 2015-02-06 NOTE — ED Provider Notes (Signed)
CSN: [id number]     Arrival date & time 02/06/15  1355 History  This chart was scribed for Bethann Berkshire, MD by Ronney Lion, ED Scribe. This patient was seen in room APA01/APA01 and the patient's care was started at 2:30 PM.    Chief Complaint  Patient presents with  . Flank Pain    Patient is a 59 y.o. female presenting with flank pain. The history is provided by the patient and medical records. No language interpreter was used.  Flank Pain This is a new problem. The current episode started more than 2 days ago. The problem occurs constantly. The problem has not changed since onset.Associated symptoms include shortness of breath. Pertinent negatives include no chest pain, no abdominal pain and no headaches. Exacerbated by: movement. Nothing relieves the symptoms. She has tried nothing for the symptoms.    HPI Comments: Jessica Russo is a 59 y.o. female who presents to the Emergency Department complaining of constant, mild left flank pain that started 3 days ago. She also reports mild SOB in the past 3 days. Movement exacerbates the pain. Deep inspiration doesn't affect it. She also states she fell on her face 1 week ago. Per nursing note, patient denies any urinary symptoms.  Past Medical History  Diagnosis Date  . Hypertension   . Hypercholesteremia    History reviewed. No pertinent past surgical history. History reviewed. No pertinent family history. History  Substance Use Topics  . Smoking status: Never Smoker   . Smokeless tobacco: Not on file  . Alcohol Use: No   OB History    No data available     Review of Systems  Constitutional: Negative for appetite change and fatigue.  HENT: Negative for congestion, ear discharge and sinus pressure.   Eyes: Negative for discharge.  Respiratory: Positive for shortness of breath. Negative for cough.   Cardiovascular: Negative for chest pain.  Gastrointestinal: Negative for abdominal pain and diarrhea.  Genitourinary: Positive for flank  pain. Negative for frequency and hematuria.  Musculoskeletal: Negative for back pain.  Skin: Negative for rash.  Neurological: Negative for seizures and headaches.  Psychiatric/Behavioral: Negative for hallucinations.      Allergies  Review of patient's allergies indicates no known allergies.  Home Medications   Prior to Admission medications   Medication Sig Start Date End Date Taking? Authorizing Provider  ibuprofen (ADVIL,MOTRIN) 200 MG tablet Take 400 mg by mouth every 6 (six) hours as needed for moderate pain.   Yes Historical Provider, MD  lisinopril (PRINIVIL,ZESTRIL) 2.5 MG tablet Take 2.5 mg by mouth daily.   Yes Historical Provider, MD  lovastatin (MEVACOR) 10 MG tablet Take 10 mg by mouth at bedtime.   Yes Historical Provider, MD  traMADol (ULTRAM) 50 MG tablet Take 1 tablet (50 mg total) by mouth every 6 (six) hours as needed. Patient not taking: Reported on 02/06/2015 01/11/15   Dione Booze, MD   BP 135/70 mmHg  Pulse 89  Temp(Src) 98.4 F (36.9 C) (Oral)  Resp 18  Ht 5' 3.5" (1.613 m)  Wt 155 lb (70.308 kg)  BMI 27.02 kg/m2  SpO2 99% Physical Exam  Constitutional: She is oriented to person, place, and time. She appears well-developed.  HENT:  Head: Normocephalic.  Eyes: Conjunctivae and EOM are normal. No scleral icterus.  Neck: Neck supple. No thyromegaly present.  Cardiovascular: Normal rate and regular rhythm.  Exam reveals no gallop and no friction rub.   No murmur heard. Pulmonary/Chest: No stridor. She has no wheezes.  She has no rales. She exhibits no tenderness.  Abdominal: She exhibits no distension. There is no tenderness. There is no rebound.  Genitourinary:  Mild left flank tenderness.  Musculoskeletal: Normal range of motion. She exhibits no edema.  Lymphadenopathy:    She has no cervical adenopathy.  Neurological: She is oriented to person, place, and time. She exhibits normal muscle tone. Coordination normal.  Skin: No rash noted. No erythema.   Psychiatric: She has a normal mood and affect. Her behavior is normal.  Nursing note and vitals reviewed.   ED Course  Procedures (including critical care time)  DIAGNOSTIC STUDIES: Oxygen Saturation is 99% on room air, normal by my interpretation.    COORDINATION OF CARE: 2:32 PM - Discussed treatment plan with pt at bedside which includes XR and blood tests, and pt agreed to plan.   Labs Review Labs Reviewed - No data to display  Imaging Review No results found.   EKG Interpretation None      MDM   Final diagnoses:  None    Flank pain from fall,  Nl studies,  tx with ultram and follow up   The chart was scribed for me under my direct supervision.  I personally performed the history, physical, and medical decision making and all procedures in the evaluation of this patient.Bethann Berkshire.     Allure Greaser, MD 02/06/15 [phone]

## 2015-02-06 NOTE — ED Notes (Signed)
EDP at bedside  

## 2015-02-06 NOTE — ED Notes (Signed)
Pain lt flank for 3 days,  No urinary sx.  Vomited  Today,   No injury .  Hurts to move.

## 2015-07-17 IMAGING — CT CT ANGIO CHEST
2 of 6 series · 6 of 36 positions shown · IV contrast (omnipaque)
Comparison: Chest/left rib radiographs dated 02/06/2015

CLINICAL DATA: Left flank pain, shortness of breath x3 days. Status
post fall.

EXAM:
CT ANGIOGRAPHY CHEST WITH CONTRAST
TECHNIQUE: Multidetector CT imaging of the chest was performed using the
standard protocol during bolus administration of intravenous
contrast. Multiplanar CT image reconstructions and MIPs were
obtained to evaluate the vascular anatomy.
CONTRAST:  100mL OMNIPAQUE IOHEXOL 350 MG/ML SOLN

[Series 4: pe 3.0 b40f · axial · 0.61mm/px · z∈[-168,-12]mm · 5 of 80 slices shown]
[im 14/80  lung]
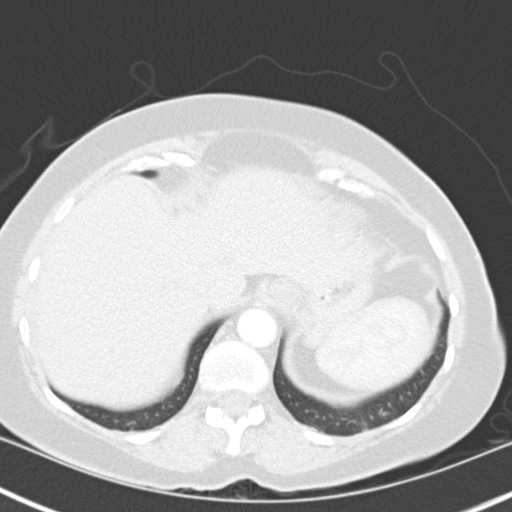
[im 27/80  mediastinal]
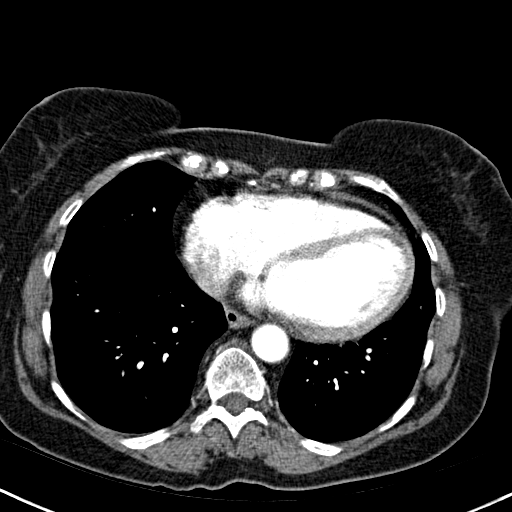
[im 40/80  lung]
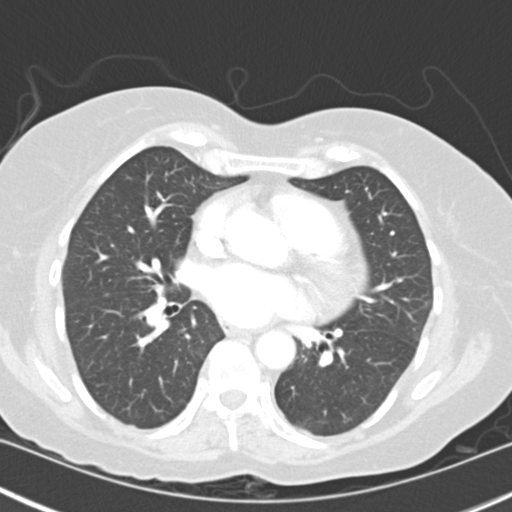
[im 53/80  mediastinal]
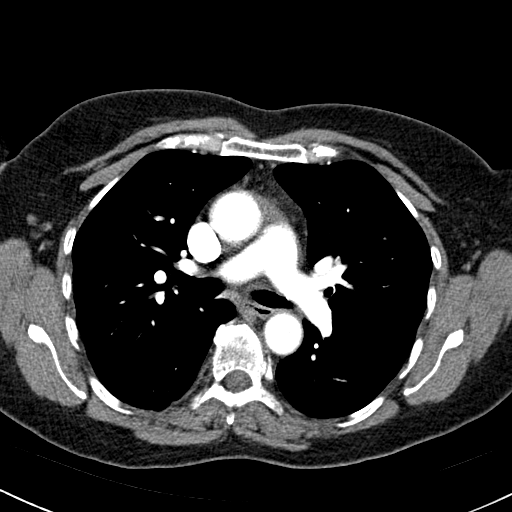
[im 66/80  lung]
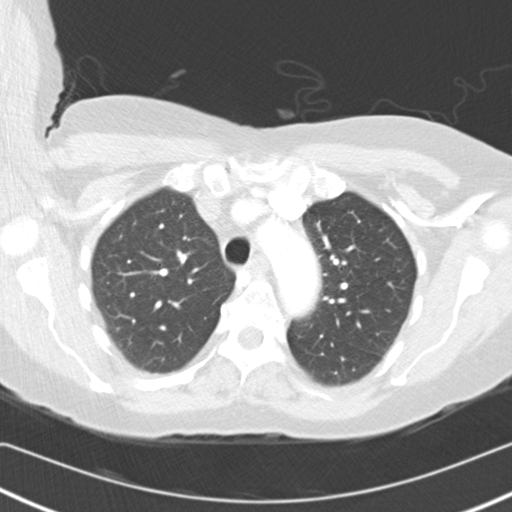

[Series 7: mpr coronal pe 3mm · coronal · 0.47mm/px · 1 of 78 slices shown]
[im 39/78  mediastinal]
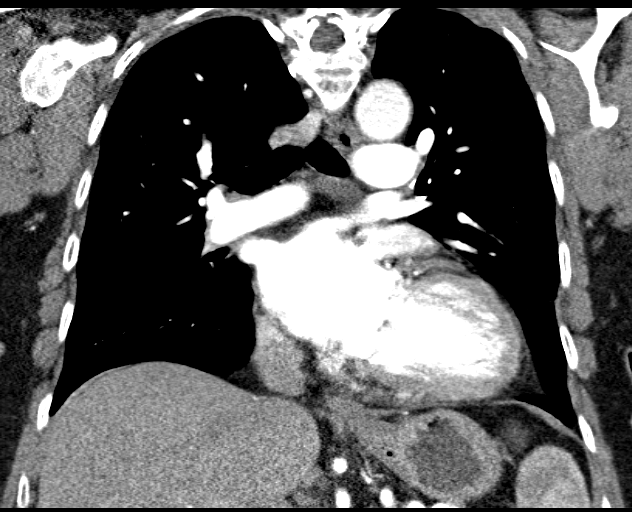

[6 of 36 positions shown; findings below may reference images not displayed]

FINDINGS: No evidence of pulmonary embolism.

Mediastinum/Nodes: Heart is normal in size. No pericardial effusion.

Age advanced coronary atherosclerosis in the LAD and circumflex.

No suspicious mediastinal, hilar, or axillary lymphadenopathy.

Visualized thyroid is unremarkable.

Lungs/Pleura: Subpleural patchy opacity in the right upper lobe and
lingula (series 5/ image 48), likely atelectasis.

No focal consolidation.

No suspicious pulmonary nodules.

No pleural effusion or pneumothorax.

Upper abdomen: Visualized upper abdomen is unremarkable.

Musculoskeletal: Mild degenerative changes of the thoracic spine.

No displaced left rib fracture is seen.

Review of the MIP images confirms the above findings.
IMPRESSION: No evidence of pulmonary embolism.

No evidence of acute cardiopulmonary disease.

No displaced left rib fracture is seen.

Age advanced coronary atherosclerosis.

## 2015-09-20 ENCOUNTER — Encounter (HOSPITAL_COMMUNITY): Payer: Self-pay | Admitting: Emergency Medicine

## 2015-09-20 ENCOUNTER — Emergency Department (HOSPITAL_COMMUNITY)
Admission: EM | Admit: 2015-09-20 | Discharge: 2015-09-20 | Disposition: A | Discharge status: Home / Self Care | Payer: Self-pay | Attending: Emergency Medicine | Admitting: Emergency Medicine

## 2015-09-20 DIAGNOSIS — K047 Periapical abscess without sinus: Secondary | ICD-10-CM

## 2015-09-20 DIAGNOSIS — I10 Essential (primary) hypertension: Secondary | ICD-10-CM | POA: Insufficient documentation

## 2015-09-20 DIAGNOSIS — Z79899 Other long term (current) drug therapy: Secondary | ICD-10-CM | POA: Insufficient documentation

## 2015-09-20 DIAGNOSIS — E7800 Pure hypercholesterolemia, unspecified: Secondary | ICD-10-CM | POA: Insufficient documentation

## 2015-09-20 DIAGNOSIS — K029 Dental caries, unspecified: Secondary | ICD-10-CM | POA: Insufficient documentation

## 2015-09-20 MED ORDER — ACETAMINOPHEN 325 MG PO TABS
650.0000 mg | ORAL_TABLET | Freq: Once | ORAL | Status: AC
  Administered 2015-09-20: 650 mg via ORAL
  Filled 2015-09-20: qty 2

## 2015-09-20 MED ORDER — AMOXICILLIN 250 MG PO CAPS
500.0000 mg | ORAL_CAPSULE | Freq: Once | ORAL | Status: AC
  Administered 2015-09-20: 500 mg via ORAL
  Filled 2015-09-20: qty 2

## 2015-09-20 MED ORDER — IBUPROFEN 600 MG PO TABS: 600.0000 mg | ORAL_TABLET | Freq: Four times a day (QID) | ORAL | Status: DC | PRN

## 2015-09-20 MED ORDER — IBUPROFEN 800 MG PO TABS
800.0000 mg | ORAL_TABLET | Freq: Once | ORAL | Status: DC
  Filled 2015-09-20: qty 1

## 2015-09-20 MED ORDER — AMOXICILLIN 500 MG PO CAPS: 500.0000 mg | ORAL_CAPSULE | Freq: Three times a day (TID) | ORAL | Status: DC

## 2015-09-20 NOTE — ED Notes (Signed)
Pt c/o multiple dental abscesses.

## 2015-09-20 NOTE — Discharge Instructions (Signed)
Please use Amoxil and ibuprofen daily. Please take these medicines with food. Please see your dentist as sone as possible concerning these multiple dental issues. Dental Caries Dental caries is tooth decay. This decay can cause a hole in teeth (cavity) that can get bigger and deeper over time. HOME CARE  Brush and floss your teeth. Do this at least two times a day.  Use a fluoride toothpaste.  Use a mouth rinse if told by your dentist or doctor.  Eat less sugary and starchy foods. Drink less sugary drinks.  Avoid snacking often on sugary and starchy foods. Avoid sipping often on sugary drinks.  Keep regular checkups and cleanings with your dentist.  Use fluoride supplements if told by your dentist or doctor.  Allow fluoride to be applied to teeth if told by your dentist or doctor.   This information is not intended to replace advice given to you by your health care provider. Make sure you discuss any questions you have with your health care provider.   Document Released: 08/09/2008 Document Revised: 11/21/2014 Document Reviewed: 11/02/2012 Elsevier Interactive Patient Education [employer]2016 Elsevier Inc.

## 2015-09-20 NOTE — ED Provider Notes (Signed)
CSN: [id number]     Arrival date & time 09/20/15  0901 History   First MD Initiated Contact with Patient 09/20/15 [phone]     Chief Complaint  Patient presents with  . Dental Pain     (Consider location/radiation/quality/duration/timing/severity/associated sxs/prior Treatment) Patient is a 59 y.o. female presenting with tooth pain. The history is provided by the patient.  Dental Pain Location:  Generalized Quality:  Throbbing Severity:  Moderate Onset quality:  Gradual Timing:  Intermittent Progression:  Worsening Chronicity:  Chronic Context: abscess and dental caries   Relieved by:  Nothing Associated symptoms: gum swelling   Associated symptoms: no drooling and no trismus   Risk factors: lack of dental care   Risk factors: no immunosuppression and no smoking     Past Medical History  Diagnosis Date  . Hypertension   . Hypercholesteremia    History reviewed. No pertinent past surgical history. No family history on file. Social History  Substance Use Topics  . Smoking status: Never Smoker   . Smokeless tobacco: None  . Alcohol Use: No   OB History    No data available     Review of Systems  HENT: Positive for dental problem. Negative for drooling.   All other systems reviewed and are negative.     Allergies  Review of patient's allergies indicates no known allergies.  Home Medications   Prior to Admission medications   Medication Sig Start Date End Date Taking? Authorizing Provider  ibuprofen (ADVIL,MOTRIN) 200 MG tablet Take 400 mg by mouth every 6 (six) hours as needed for moderate pain.   Yes Historical Provider, MD  lisinopril (PRINIVIL,ZESTRIL) 2.5 MG tablet Take 2.5 mg by mouth daily.   Yes Historical Provider, MD  lovastatin (MEVACOR) 10 MG tablet Take 10 mg by mouth at bedtime.   Yes Historical Provider, MD   BP 138/84 mmHg  Pulse 93  Temp(Src) 98.4 F (36.9 C)  Resp 18  Ht 5\' 3"  (1.6 m)  Wt 152 lb (68.947 kg)  BMI 26.93 kg/m2  SpO2  97% Physical Exam  Constitutional: She is oriented to person, place, and time. She appears well-developed and well-nourished.  Non-toxic appearance.  HENT:  Head: Normocephalic.  Right Ear: Tympanic membrane and external ear normal.  Left Ear: Tympanic membrane and external ear normal.  Multiple dental caries noted of the upper and lower jaw. Left greater than right. No visible abscess appreciated. The airway is patent. Is no swelling under the tongue.  Eyes: EOM and lids are normal. Pupils are equal, round, and reactive to light.  Neck: Normal range of motion. Neck supple. Carotid bruit is not present.  Cardiovascular: Normal rate, regular rhythm, normal heart sounds, intact distal pulses and normal pulses.   No murmur heard. Pulmonary/Chest: Breath sounds normal. No respiratory distress.  Abdominal: Soft. Bowel sounds are normal. There is no tenderness. There is no guarding.  Musculoskeletal: Normal range of motion.  Lymphadenopathy:       Head (right side): No submandibular adenopathy present.       Head (left side): No submandibular adenopathy present.    She has no cervical adenopathy.  Neurological: She is alert and oriented to person, place, and time. She has normal strength. No cranial nerve deficit or sensory deficit.  Skin: Skin is warm and dry.  Psychiatric: She has a normal mood and affect. Her speech is normal.  Nursing note and vitals reviewed.   ED Course  Procedures (including critical care time) Labs Review Labs Reviewed -  No data to display  Imaging Review No results found. I have personally reviewed and evaluated these images and lab results as part of my medical decision-making.   EKG Interpretation None      MDM  Vital signs are well within normal limits. The patient's speech is clear and understandable. There is no evidence for trismus. There is no evidence for Ludwig's Angina. the patient will be treated with ibuprofen 800 mg, and Amoxil 500 mg. The  patient states she is in the process of seeing one of the dental specialty clinics at [facility].    Final diagnoses:  None    **I have reviewed nursing notes, vital signs, and all appropriate lab and imaging results for this patient.Ivery Quale, PA-C 09/20/15 [id number]  Vanetta Mulders, MD 09/20/15 [phone]

## 2015-12-30 ENCOUNTER — Encounter (HOSPITAL_COMMUNITY): Payer: Self-pay | Admitting: Emergency Medicine

## 2015-12-30 ENCOUNTER — Emergency Department (HOSPITAL_COMMUNITY)
Admission: EM | Admit: 2015-12-30 | Discharge: 2015-12-30 | Disposition: A | Discharge status: Home / Self Care | Payer: Self-pay | Attending: Emergency Medicine | Admitting: Emergency Medicine

## 2015-12-30 ENCOUNTER — Emergency Department (HOSPITAL_COMMUNITY): Payer: Self-pay

## 2015-12-30 DIAGNOSIS — Z79899 Other long term (current) drug therapy: Secondary | ICD-10-CM | POA: Insufficient documentation

## 2015-12-30 DIAGNOSIS — J209 Acute bronchitis, unspecified: Secondary | ICD-10-CM | POA: Insufficient documentation

## 2015-12-30 DIAGNOSIS — K0889 Other specified disorders of teeth and supporting structures: Secondary | ICD-10-CM | POA: Insufficient documentation

## 2015-12-30 DIAGNOSIS — I10 Essential (primary) hypertension: Secondary | ICD-10-CM | POA: Insufficient documentation

## 2015-12-30 DIAGNOSIS — J40 Bronchitis, not specified as acute or chronic: Secondary | ICD-10-CM

## 2015-12-30 DIAGNOSIS — E7800 Pure hypercholesterolemia, unspecified: Secondary | ICD-10-CM | POA: Insufficient documentation

## 2015-12-30 MED ORDER — IBUPROFEN 800 MG PO TABS
800.0000 mg | ORAL_TABLET | Freq: Once | ORAL | Status: AC
  Administered 2015-12-30: 800 mg via ORAL
  Filled 2015-12-30: qty 1

## 2015-12-30 MED ORDER — IBUPROFEN 600 MG PO TABS: 600.0000 mg | ORAL_TABLET | Freq: Four times a day (QID) | ORAL | Status: DC | PRN

## 2015-12-30 MED ORDER — AMOXICILLIN 500 MG PO CAPS: 500.0000 mg | ORAL_CAPSULE | Freq: Three times a day (TID) | ORAL | Status: DC

## 2015-12-30 NOTE — ED Notes (Signed)
Pt here with multiple complaints. Reports persistent, productive cough x 1 week. Reports two episodes of vomiting yesterday. Pt also c/o dental pain to lower jaw.

## 2015-12-30 NOTE — ED Notes (Signed)
Pt in xray

## 2015-12-30 NOTE — ED Provider Notes (Signed)
CSN: [id number]     Arrival date & time 12/30/15  4098 History   First MD Initiated Contact with Patient 12/30/15 0805     Chief Complaint  Patient presents with  . Cough  . Dental Pain     (Consider location/radiation/quality/duration/timing/severity/associated sxs/prior Treatment) Patient is a 60 y.o. female presenting with cough and tooth pain. The history is provided by the patient. No language interpreter was used.  Cough Cough characteristics:  Productive Sputum characteristics:  Clear Severity:  Moderate Onset quality:  Gradual Progression:  Worsening Chronicity:  New Smoker: yes   Relieved by:  Nothing Worsened by:  Nothing tried Associated symptoms: rhinorrhea   Dental Pain Pt also complains of bad teeth, possible abscess.  Pt has history of bad teeth.  She is planning to go to [place]  Past Medical History  Diagnosis Date  . Hypertension   . Hypercholesteremia    History reviewed. No pertinent past surgical history. No family history on file. Social History  Substance Use Topics  . Smoking status: Never Smoker   . Smokeless tobacco: None  . Alcohol Use: No   OB History    No data available     Review of Systems  HENT: Positive for rhinorrhea.   Respiratory: Positive for cough.   All other systems reviewed and are negative.     Allergies  Review of patient's allergies indicates no known allergies.  Home Medications   Prior to Admission medications   Medication Sig Start Date End Date Taking? Authorizing Provider  amoxicillin (AMOXIL) 500 MG capsule Take 1 capsule (500 mg total) by mouth 3 (three) times daily. 09/20/15   Ivery Quale, PA-C  ibuprofen (ADVIL,MOTRIN) 600 MG tablet Take 1 tablet (600 mg total) by mouth every 6 (six) hours as needed. 09/20/15   Ivery Quale, PA-C  lisinopril (PRINIVIL,ZESTRIL) 2.5 MG tablet Take 2.5 mg by mouth daily.    Historical Provider, MD  lovastatin (MEVACOR) 10 MG tablet Take 10 mg by mouth at bedtime.     Historical Provider, MD   BP 130/78 mmHg  Pulse 88  Temp(Src) 97.7 F (36.5 C) (Oral)  Resp 16  Ht 5' 3.5" (1.613 m)  Wt 66.679 kg  BMI 25.63 kg/m2  SpO2 96% Physical Exam  Constitutional: She is oriented to person, place, and time. She appears well-developed and well-nourished.  HENT:  Head: Normocephalic and atraumatic.  Eyes: Conjunctivae and EOM are normal. Pupils are equal, round, and reactive to light.  Neck: Normal range of motion. Neck supple.  Cardiovascular: Normal rate.   Pulmonary/Chest: Effort normal.  Abdominal: Soft. She exhibits no distension. There is no tenderness.  Musculoskeletal: Normal range of motion.  Neurological: She is alert and oriented to person, place, and time.  Skin: Skin is warm.  Psychiatric: She has a normal mood and affect.  Nursing note and vitals reviewed.   ED Course  Procedures (including critical care time) Labs Review Labs Reviewed - No data to display  Imaging Review Dg Chest 2 View  12/30/2015  CLINICAL DATA:  Cough, chest congestion, fever and chills, 1 week duration. EXAM: CHEST  2 VIEW COMPARISON:  02/06/2015 FINDINGS: The heart is enlarged. There is calcification of the thoracic aorta. The pulmonary vascularity is normal. The lungs are clear. No effusions. Ordinary degenerative changes affect the spine. IMPRESSION: Chronic cardiomegaly. Atherosclerosis of the aorta. No active process evident. Electronically Signed   By: Paulina Fusi M.D.   On: 12/30/2015 08:35   I have personally reviewed  and evaluated these images and lab results as part of my medical decision-making.   EKG Interpretation None      MDM   Final diagnoses:  Bronchitis  Toothache    Meds ordered this encounter  Medications  . amoxicillin (AMOXIL) 500 MG capsule    Sig: Take 1 capsule (500 mg total) by mouth 3 (three) times daily.    Dispense:  21 capsule    Refill:  0    Order Specific Question:  Supervising Provider    Answer:  MILLER, BRIAN  [[id number]]  . ibuprofen (ADVIL,MOTRIN) 600 MG tablet    Sig: Take 1 tablet (600 mg total) by mouth every 6 (six) hours as needed.    Dispense:  20 tablet    Refill:  0    Order Specific Question:  Supervising Provider    Answer:  Eber Hong [[id number]]      Lonia Skinner Perrin, PA-C 12/30/15 [id number]  Vanetta Mulders, MD 12/31/15 1339

## 2015-12-30 NOTE — Discharge Instructions (Signed)
Dental Pain Dental pain may be caused by many things, including:  Tooth decay (cavities or caries). Cavities expose the nerve of your tooth to air and hot or cold temperatures. This can cause pain or discomfort.  Abscess or infection. A dental abscess is a collection of infected pus from a bacterial infection in the inner part of the tooth (pulp). It usually occurs at the end of the tooth's root.  Injury.  An unknown reason (idiopathic). Your pain may be mild or severe. It may only occur when:  You are chewing.  You are exposed to hot or cold temperature.  You are eating or drinking sugary foods or beverages, such as soda or candy. Your pain may also be constant. HOME CARE INSTRUCTIONS Watch your dental pain for any changes. The following actions may help to lessen any discomfort that you are feeling:  Take medicines only as directed by your dentist.  If you were prescribed an antibiotic medicine, finish all of it even if you start to feel better.  Keep all follow-up visits as directed by your dentist. This is important.  Do not apply heat to the outside of your face.  Rinse your mouth or gargle with salt water if directed by your dentist. This helps with pain and swelling.  You can make salt water by adding  tsp of salt to 1 cup of warm water.  Apply ice to the painful area of your face:  Put ice in a plastic bag.  Place a towel between your skin and the bag.  Leave the ice on for 20 minutes, 2-3 times per day.  Avoid foods or drinks that cause you pain, such as:  Very hot or very cold foods or drinks.  Sweet or sugary foods or drinks. SEEK MEDICAL CARE IF:  Your pain is not controlled with medicines.  Your symptoms are worse.  You have new symptoms. SEEK IMMEDIATE MEDICAL CARE IF:  You are unable to open your mouth.  You are having trouble breathing or swallowing.  You have a fever.  Your face, neck, or jaw is swollen.   This information is not  intended to replace advice given to you by your health care provider. Make sure you discuss any questions you have with your health care provider.   Document Released: 10/31/2005 Document Revised: 03/17/2015 Document Reviewed: 10/27/2014 Elsevier Interactive Patient Education 2016 Elsevier Inc. Acute Bronchitis Bronchitis is inflammation of the airways that extend from the windpipe into the lungs (bronchi). The inflammation often causes mucus to develop. This leads to a cough, which is the most common symptom of bronchitis.  In acute bronchitis, the condition usually develops suddenly and goes away over time, usually in a couple weeks. Smoking, allergies, and asthma can make bronchitis worse. Repeated episodes of bronchitis may cause further lung problems.  CAUSES Acute bronchitis is most often caused by the same virus that causes a cold. The virus can spread from person to person (contagious) through coughing, sneezing, and touching contaminated objects. SIGNS AND SYMPTOMS   Cough.   Fever.   Coughing up mucus.   Body aches.   Chest congestion.   Chills.   Shortness of breath.   Sore throat.  DIAGNOSIS  Acute bronchitis is usually diagnosed through a physical exam. Your health care provider will also ask you questions about your medical history. Tests, such as chest X-rays, are sometimes done to rule out other conditions.  TREATMENT  Acute bronchitis usually goes away in a couple weeks. Oftentimes,  no medical treatment is necessary. Medicines are sometimes given for relief of fever or cough. Antibiotic medicines are usually not needed but may be prescribed in certain situations. In some cases, an inhaler may be recommended to help reduce shortness of breath and control the cough. A cool mist vaporizer may also be used to help thin bronchial secretions and make it easier to clear the chest.  HOME CARE INSTRUCTIONS  Get plenty of rest.   Drink enough fluids to keep your  urine clear or pale yellow (unless you have a medical condition that requires fluid restriction). Increasing fluids may help thin your respiratory secretions (sputum) and reduce chest congestion, and it will prevent dehydration.   Take medicines only as directed by your health care provider.  If you were prescribed an antibiotic medicine, finish it all even if you start to feel better.  Avoid smoking and secondhand smoke. Exposure to cigarette smoke or irritating chemicals will make bronchitis worse. If you are a smoker, consider using nicotine gum or skin patches to help control withdrawal symptoms. Quitting smoking will help your lungs heal faster.   Reduce the chances of another bout of acute bronchitis by washing your hands frequently, avoiding people with cold symptoms, and trying not to touch your hands to your mouth, nose, or eyes.   Keep all follow-up visits as directed by your health care provider.  SEEK MEDICAL CARE IF: Your symptoms do not improve after 1 week of treatment.  SEEK IMMEDIATE MEDICAL CARE IF:  You develop an increased fever or chills.   You have chest pain.   You have severe shortness of breath.  You have bloody sputum.   You develop dehydration.  You faint or repeatedly feel like you are going to pass out.  You develop repeated vomiting.  You develop a severe headache. MAKE SURE YOU:   Understand these instructions.  Will watch your condition.  Will get help right away if you are not doing well or get worse.   This information is not intended to replace advice given to you by your health care provider. Make sure you discuss any questions you have with your health care provider.   Document Released: 12/08/2004 Document Revised: 11/21/2014 Document Reviewed: 04/23/2013 Elsevier Interactive Patient Education [employer]

## 2017-10-11 ENCOUNTER — Emergency Department (HOSPITAL_COMMUNITY)
Admission: EM | Admit: 2017-10-11 | Discharge: 2017-10-11 | Disposition: A | Discharge status: Home / Self Care | Payer: Self-pay | Attending: Emergency Medicine | Admitting: Emergency Medicine

## 2017-10-11 ENCOUNTER — Other Ambulatory Visit: Payer: Self-pay

## 2017-10-11 ENCOUNTER — Emergency Department (HOSPITAL_COMMUNITY): Payer: Self-pay

## 2017-10-11 DIAGNOSIS — K047 Periapical abscess without sinus: Secondary | ICD-10-CM | POA: Insufficient documentation

## 2017-10-11 DIAGNOSIS — I10 Essential (primary) hypertension: Secondary | ICD-10-CM | POA: Insufficient documentation

## 2017-10-11 DIAGNOSIS — H539 Unspecified visual disturbance: Secondary | ICD-10-CM | POA: Insufficient documentation

## 2017-10-11 DIAGNOSIS — H81399 Other peripheral vertigo, unspecified ear: Secondary | ICD-10-CM | POA: Insufficient documentation

## 2017-10-11 DIAGNOSIS — R110 Nausea: Secondary | ICD-10-CM | POA: Insufficient documentation

## 2017-10-11 DIAGNOSIS — Z79899 Other long term (current) drug therapy: Secondary | ICD-10-CM | POA: Insufficient documentation

## 2017-10-11 LAB — URINALYSIS, ROUTINE W REFLEX MICROSCOPIC
BILIRUBIN URINE: NEGATIVE
Glucose, UA: NEGATIVE mg/dL
HGB URINE DIPSTICK: NEGATIVE
Ketones, ur: NEGATIVE mg/dL
Leukocytes, UA: NEGATIVE
NITRITE: NEGATIVE
PROTEIN: NEGATIVE mg/dL
SPECIFIC GRAVITY, URINE: 1.008 (ref 1.005–1.030)
pH: 6 (ref 5.0–8.0)

## 2017-10-11 LAB — BASIC METABOLIC PANEL
ANION GAP: 9 (ref 5–15)
BUN: 9 mg/dL (ref 6–20)
CALCIUM: 9.2 mg/dL (ref 8.9–10.3)
CO2: 27 mmol/L (ref 22–32)
CREATININE: 0.37 mg/dL — AB (ref 0.44–1.00)
Chloride: 101 mmol/L (ref 101–111)
Glucose, Bld: 108 mg/dL — ABNORMAL HIGH (ref 65–99)
Potassium: 3.6 mmol/L (ref 3.5–5.1)
Sodium: 137 mmol/L (ref 135–145)

## 2017-10-11 LAB — CBC
HCT: 41.8 % (ref 36.0–46.0)
HEMOGLOBIN: 12.6 g/dL (ref 12.0–15.0)
MCH: 24.6 pg — ABNORMAL LOW (ref 26.0–34.0)
MCHC: 30.1 g/dL (ref 30.0–36.0)
MCV: 81.5 fL (ref 78.0–100.0)
PLATELETS: 358 10*3/uL (ref 150–400)
RBC: 5.13 MIL/uL — AB (ref 3.87–5.11)
RDW: 15.3 % (ref 11.5–15.5)
WBC: 8 10*3/uL (ref 4.0–10.5)

## 2017-10-11 LAB — CBG MONITORING, ED: GLUCOSE-CAPILLARY: 100 mg/dL — AB (ref 65–99)

## 2017-10-11 MED ORDER — CEPHALEXIN 500 MG PO CAPS
500.0000 mg | ORAL_CAPSULE | Freq: Three times a day (TID) | ORAL | 0 refills | Status: DC
Start: 2017-10-11 — End: 2018-07-15

## 2017-10-11 MED ORDER — SODIUM CHLORIDE 0.9 % IV BOLUS (SEPSIS)
500.0000 mL | Freq: Once | INTRAVENOUS | Status: AC
  Administered 2017-10-11: 500 mL via INTRAVENOUS

## 2017-10-11 MED ORDER — LORAZEPAM 2 MG/ML IJ SOLN
0.5000 mg | Freq: Once | INTRAMUSCULAR | Status: AC
  Administered 2017-10-11: 0.5 mg via INTRAVENOUS
  Filled 2017-10-11: qty 1

## 2017-10-11 MED ORDER — MECLIZINE HCL 25 MG PO TABS: 25.0000 mg | ORAL_TABLET | Freq: Three times a day (TID) | ORAL | 0 refills | Status: DC | PRN

## 2017-10-11 MED ORDER — MECLIZINE HCL 12.5 MG PO TABS
25.0000 mg | ORAL_TABLET | Freq: Once | ORAL | Status: AC
  Administered 2017-10-11: 25 mg via ORAL
  Filled 2017-10-11: qty 2

## 2017-10-11 NOTE — ED Triage Notes (Signed)
Patient complains of dizziness and feeling like she is going to pass out. She states symptoms started at 7 am this morning. States nausea with dizziness.  Denies diarrhea, vomiting.

## 2017-10-11 NOTE — ED Provider Notes (Signed)
[facility] EMERGENCY DEPARTMENT Provider Note   CSN: [id number] Arrival date & time: 10/11/17  1238     History   Chief Complaint Chief Complaint  Patient presents with  . Dizziness    HPI Jessica Russo is a 61 y.o. female.  HPI Patient presents with acute onset dizziness that started at 7 AM.  States dizziness started when she attempted to get up off the couch.  Associated with nausea.  Worse with any movement.  Denies any focal weakness or numbness.  Denies pain including chest pain or abdominal pain.  Patient states she does have chronic sinus issues but denies hearing loss or tinnitus. Past Medical History:  Diagnosis Date  . Hypercholesteremia   . Hypertension     There are no active problems to display for this patient.   No past surgical history on file.  OB History    No data available       Home Medications    Prior to Admission medications   Medication Sig Start Date End Date Taking? Authorizing Provider  lisinopril-hydrochlorothiazide (PRINZIDE,ZESTORETIC) 10-12.5 MG tablet Take 1 tablet by mouth daily.   Yes [provider]  lovastatin (MEVACOR) 10 MG tablet Take 10 mg by mouth at bedtime.   Yes [provider]  amoxicillin (AMOXIL) 500 MG capsule Take 1 capsule (500 mg total) by mouth 3 (three) times daily. Patient not taking: Reported on 10/11/2017 12/30/15   Elson AreasSofia, Leslie K, PA-C  cephALEXin (KEFLEX) 500 MG capsule Take 1 capsule (500 mg total) by mouth 3 (three) times daily. 10/11/17   Loren RacerYelverton, Jameeka Marcy, MD  ibuprofen (ADVIL,MOTRIN) 600 MG tablet Take 1 tablet (600 mg total) by mouth every 6 (six) hours as needed. Patient not taking: Reported on 10/11/2017 12/30/15   Elson AreasSofia, Leslie K, PA-C  meclizine (ANTIVERT) 25 MG tablet Take 1 tablet (25 mg total) by mouth 3 (three) times daily as needed for dizziness. 10/11/17   Loren RacerYelverton, Inez Rosato, MD    Family History No family history on file.  Social History Social History   Tobacco Use    . Smoking status: Never Smoker  Substance Use Topics  . Alcohol use: No  . Drug use: No     Allergies   Patient has no known allergies.   Review of Systems Review of Systems  Constitutional: Negative for chills and fever.  HENT: Positive for sinus pressure. Negative for congestion and trouble swallowing.   Eyes: Positive for visual disturbance.  Respiratory: Negative for cough and shortness of breath.   Cardiovascular: Negative for chest pain.  Gastrointestinal: Positive for nausea. Negative for abdominal pain, constipation, diarrhea and vomiting.  Musculoskeletal: Negative for back pain, myalgias and neck pain.  Skin: Negative for rash and wound.  Neurological: Positive for dizziness. Negative for syncope, weakness and headaches.  All other systems reviewed and are negative.    Physical Exam Updated Vital Signs BP 121/73   Pulse 79   Temp 98.2 F (36.8 C) (Oral)   Resp 16   Ht 5' 3.5" (1.613 m)   Wt 70.3 kg (155 lb)   SpO2 99%   BMI 27.03 kg/m   Physical Exam  Constitutional: She is oriented to person, place, and time. She appears well-developed and well-nourished. No distress.  HENT:  Head: Normocephalic and atraumatic.  Mouth/Throat: Oropharynx is clear and moist. No oropharyngeal exudate.  Eyes: EOM are normal. Pupils are equal, round, and reactive to light.  Fatigable horizontal nystagmus worsened with position change.  Neck: Normal range  of motion. Neck supple.  Cardiovascular: Normal rate and regular rhythm. Exam reveals no gallop and no friction rub.  No murmur heard. Pulmonary/Chest: Effort normal and breath sounds normal.  Abdominal: Soft. Bowel sounds are normal. There is no tenderness. There is no rebound and no guarding.  Musculoskeletal: Normal range of motion. She exhibits no edema or tenderness.  Neurological: She is alert and oriented to person, place, and time.  Patient is alert and oriented x3 with clear, goal oriented speech. Patient has 5/5  motor in all extremities. Sensation is intact to light touch. Bilateral finger-to-nose is normal with no signs of dysmetria. Patient has a normal gait and walks without assistance.  Skin: Skin is warm and dry. Capillary refill takes less than 2 seconds. No rash noted. No erythema.  Psychiatric: She has a normal mood and affect. Her behavior is normal.  Nursing note and vitals reviewed.    ED Treatments / Results  Labs (all labs ordered are listed, but only abnormal results are displayed) Labs Reviewed  BASIC METABOLIC PANEL - Abnormal; Notable for the following components:      Result Value   Glucose, Bld 108 (*)    Creatinine, Ser 0.37 (*)    All other components within normal limits  CBC - Abnormal; Notable for the following components:   RBC 5.13 (*)    MCH 24.6 (*)    All other components within normal limits  URINALYSIS, ROUTINE W REFLEX MICROSCOPIC - Abnormal; Notable for the following components:   Color, Urine STRAW (*)    All other components within normal limits  CBG MONITORING, ED - Abnormal; Notable for the following components:   Glucose-Capillary 100 (*)    All other components within normal limits    EKG  EKG Interpretation  Date/Time:  Wednesday October 11 2017 13:13:02 EST Ventricular Rate:  78 PR Interval:    QRS Duration: 78 QT Interval:  385 QTC Calculation: 439 R Axis:   49 Text Interpretation:  Sinus rhythm Probable left atrial enlargement Low voltage, precordial leads No old tracing to compare Confirmed by ElvertaJacubowitz, Doreatha MartinSam [phone]([id number]) on 10/12/2017 1:20:04 PM       Radiology Mr Brain Wo Contrast  Result Date: 10/11/2017 CLINICAL DATA:  61 year old female with dizziness, presyncope, nausea. Symptoms since 0700 hours EXAM: MRI HEAD WITHOUT CONTRAST TECHNIQUE: Multiplanar, multiecho pulse sequences of the brain and surrounding structures were obtained without intravenous contrast. COMPARISON:  CT face and cervical spine 01/11/2015 FINDINGS: Brain:  Cerebral volume is within normal limits for age. No restricted diffusion to suggest acute infarction. No midline shift, mass effect, evidence of mass lesion, ventriculomegaly, extra-axial collection or acute intracranial hemorrhage. Cervicomedullary junction and pituitary are within normal limits. Wallace CullensGray and white matter signal is within normal limits for age throughout the brain. No cortical encephalomalacia or chronic cerebral blood products identified. Deep gray matter nuclei, brainstem, and cerebellum appear normal. Vascular: Major intracranial vascular flow voids are preserved. Distal right vertebral artery appears dominant. Skull and upper cervical spine: Negative. Visualized bone marrow signal is within normal limits. Sinuses/Orbits: Negative orbit soft tissues. There is only trace paranasal sinus mucosal thickening. Other: Mastoid air cells are clear. Grossly normal visible internal auditory structures. Scalp and face soft tissues appear negative. IMPRESSION: No acute intracranial abnormality and normal for age noncontrast MRI appearance of the brain. Electronically Signed   By: Odessa FlemingH  Hall M.D.   On: 10/11/2017 13:53    Procedures Procedures (including critical care time)  Medications Ordered in ED Medications  LORazepam (ATIVAN) injection 0.5 mg (0.5 mg Intravenous Given 10/11/17 1328)  sodium chloride 0.9 % bolus 500 mL (0 mLs Intravenous Stopped 10/11/17 1544)  meclizine (ANTIVERT) tablet 25 mg (25 mg Oral Given 10/11/17 1420)     Initial Impression / Assessment and Plan / ED Course  I have reviewed the triage vital signs and the nursing notes.  Pertinent labs & imaging results that were available during my care of the patient were reviewed by me and considered in my medical decision making (see chart for details).    MRI without acute findings.  Will treat for peripheral vertigo.  Return precautions given.   Final Clinical Impressions(s) / ED Diagnoses   Final diagnoses:  Peripheral  vertigo, unspecified laterality  Dental abscess    ED Discharge Orders        Ordered    cephALEXin (KEFLEX) 500 MG capsule  3 times daily     10/11/17 1602    meclizine (ANTIVERT) 25 MG tablet  3 times daily PRN     10/11/17 1602       Loren Racer, MD 10/12/17 1507

## 2018-03-09 NOTE — Congregational Nurse Program (Signed)
Congregational Nurse Program Note  Date of Encounter: 03/09/2018  Past Medical History: Past Medical History:  Diagnosis Date  . Hypercholesteremia   . Hypertension     Encounter Details: CNP Questionnaire - 03/07/18 0925      Questionnaire   Patient Status  Not Applicable    Race  White or Caucasian    [facility] Patient Served At  [employer] [place]    Insurance  Not Applicable    Uninsured  Uninsured (NEW 1x/quarter)    Food  No food insecurities    Housing/Utilities  Yes, have permanent housing    Transportation  No transportation needs    Interpersonal Safety  Yes, feel physically and emotionally safe where you currently live    Medication  No medication insecurities    Medical Provider  Yes    Referrals  Not Applicable    ED Visit Averted  Not Applicable    Life-Saving Intervention Made  Not Applicable     Se[facility] at [facility]; [address] Jenene SlickerEmma Romaine Maciolek RN, [place] [facility], [phone]

## 2018-07-15 ENCOUNTER — Emergency Department (HOSPITAL_COMMUNITY): Payer: Self-pay

## 2018-07-15 ENCOUNTER — Emergency Department (HOSPITAL_COMMUNITY)
Admission: EM | Admit: 2018-07-15 | Discharge: 2018-07-15 | Disposition: A | Discharge status: Home / Self Care | Payer: Self-pay | Attending: Emergency Medicine | Admitting: Emergency Medicine

## 2018-07-15 ENCOUNTER — Encounter (HOSPITAL_COMMUNITY): Payer: Self-pay | Admitting: Emergency Medicine

## 2018-07-15 ENCOUNTER — Other Ambulatory Visit: Payer: Self-pay

## 2018-07-15 DIAGNOSIS — Y999 Unspecified external cause status: Secondary | ICD-10-CM | POA: Insufficient documentation

## 2018-07-15 DIAGNOSIS — S300XXA Contusion of lower back and pelvis, initial encounter: Secondary | ICD-10-CM | POA: Insufficient documentation

## 2018-07-15 DIAGNOSIS — S60212A Contusion of left wrist, initial encounter: Secondary | ICD-10-CM | POA: Insufficient documentation

## 2018-07-15 DIAGNOSIS — I10 Essential (primary) hypertension: Secondary | ICD-10-CM | POA: Insufficient documentation

## 2018-07-15 DIAGNOSIS — Y939 Activity, unspecified: Secondary | ICD-10-CM | POA: Insufficient documentation

## 2018-07-15 DIAGNOSIS — S40012A Contusion of left shoulder, initial encounter: Secondary | ICD-10-CM | POA: Insufficient documentation

## 2018-07-15 DIAGNOSIS — Y92512 Supermarket, store or market as the place of occurrence of the external cause: Secondary | ICD-10-CM | POA: Insufficient documentation

## 2018-07-15 DIAGNOSIS — W010XXA Fall on same level from slipping, tripping and stumbling without subsequent striking against object, initial encounter: Secondary | ICD-10-CM | POA: Insufficient documentation

## 2018-07-15 DIAGNOSIS — W19XXXA Unspecified fall, initial encounter: Secondary | ICD-10-CM

## 2018-07-15 MED ORDER — HYDROCODONE-ACETAMINOPHEN 5-325 MG PO TABS: ORAL_TABLET | ORAL | 0 refills | Status: DC

## 2018-07-15 MED ORDER — IBUPROFEN 800 MG PO TABS
800.0000 mg | ORAL_TABLET | Freq: Once | ORAL | Status: AC
  Administered 2018-07-15: 800 mg via ORAL
  Filled 2018-07-15: qty 1

## 2018-07-15 NOTE — ED Triage Notes (Addendum)
Patient c/o lower back, neck, bilateral knee, left hand, and left shoulder pain after slipping and falling at grocery store yesterday. Per patient slide up in liquid on the floor. Denies hitting head or LOC. Denies any complications with BMs or urination. CNS intact. Patient able to ambulate.

## 2018-07-15 NOTE — ED Provider Notes (Signed)
[facility] EMERGENCY DEPARTMENT Provider Note   CSN: [id number] Arrival date & time: 07/15/18  0847     History   Chief Complaint Chief Complaint  Patient presents with  . Fall    HPI Jessica Russo is a 62 y.o. female.  Patient states that she slipped and fell in the store and landed on her back and left shoulder and left hand.  No loss of consciousness.  Patient did not hit her head  The history is provided by the patient. No language interpreter was used.  Fall  This is a new problem. The current episode started 6 to 12 hours ago. The problem occurs rarely. The problem has been resolved. Pertinent negatives include no chest pain, no abdominal pain and no headaches. Exacerbated by: Movement. Nothing relieves the symptoms. Treatments tried: Motrin. The treatment provided mild relief.    Past Medical History:  Diagnosis Date  . Hypercholesteremia   . Hypertension     There are no active problems to display for this patient.   History reviewed. No pertinent surgical history.   OB History    Gravida  2   Para  2   Term  2   Preterm      AB      Living  2     SAB      TAB      Ectopic      Multiple      Live Births               Home Medications    Prior to Admission medications   Medication Sig Start Date End Date Taking? Authorizing Provider  lisinopril-hydrochlorothiazide (PRINZIDE,ZESTORETIC) 10-12.5 MG tablet Take 1 tablet by mouth daily.   Yes [provider]  lovastatin (MEVACOR) 10 MG tablet Take 10 mg by mouth at bedtime.   Yes [provider]  HYDROcodone-acetaminophen (NORCO/VICODIN) 5-325 MG tablet Take 1 every 6 hours for pain not helped by 800 mg of Motrin 07/15/18   Bethann Berkshire, MD    Family History Family History  Problem Relation Age of Onset  . Heart failure Father     Social History Social History   Tobacco Use  . Smoking status: Never Smoker  . Smokeless tobacco: Never Used  Substance Use Topics   . Alcohol use: No  . Drug use: No     Allergies   Patient has no known allergies.   Review of Systems Review of Systems  Constitutional: Negative for appetite change and fatigue.  HENT: Negative for congestion, ear discharge and sinus pressure.   Eyes: Negative for discharge.  Respiratory: Negative for cough.   Cardiovascular: Negative for chest pain.  Gastrointestinal: Negative for abdominal pain and diarrhea.  Genitourinary: Negative for frequency and hematuria.  Musculoskeletal: Negative for back pain.       Left shoulder hand and lumbar spine pain  Skin: Negative for rash.  Neurological: Negative for seizures and headaches.  Psychiatric/Behavioral: Negative for hallucinations.     Physical Exam Updated Vital Signs BP 128/75 (BP Location: Right Arm)   Pulse 89   Temp 97.8 F (36.6 C) (Oral)   Resp 18   Ht 5' 3.5" (1.613 m)   Wt 67.1 kg   SpO2 100%   BMI 25.81 kg/m   Physical Exam  Constitutional: She is oriented to person, place, and time. She appears well-developed.  HENT:  Head: Normocephalic.  Eyes: Conjunctivae and EOM are normal. No scleral icterus.  Neck: Neck  supple. No thyromegaly present.  Cardiovascular: Normal rate and regular rhythm. Exam reveals no gallop and no friction rub.  No murmur heard. Pulmonary/Chest: No stridor. She has no wheezes. She has no rales. She exhibits no tenderness.  Abdominal: She exhibits no distension. There is no tenderness. There is no rebound.  Musculoskeletal: Normal range of motion. She exhibits no edema.  Swelling to the left hand and wrist but full range of motion.  Mild tenderness to posterior left shoulder with pain on movement but full range of motion.  Moderate tenderness lumbar spine with negative straight leg raise  Lymphadenopathy:    She has no cervical adenopathy.  Neurological: She is oriented to person, place, and time. She exhibits normal muscle tone. Coordination normal.  Skin: No rash noted. No  erythema.  Psychiatric: She has a normal mood and affect. Her behavior is normal.     ED Treatments / Results  Labs (all labs ordered are listed, but only abnormal results are displayed) Labs Reviewed - No data to display  EKG None  Radiology Dg Lumbar Spine Complete  Result Date: 07/15/2018 CLINICAL DATA:  Recent fall, low back pain EXAM: LUMBAR SPINE - COMPLETE 4+ VIEW COMPARISON:  07/15/2018 FINDINGS: Advanced lumbar degenerative spondylosis with marked disc space narrowing, sclerosis and osteophytes spanning L1-L4. There is an associated levoscoliosis centered at L3. No acute compression fracture, wedge-shaped deformity or focal kyphosis. Preserved vertebral body heights. No visualized pars defects. Normal SI joints. Visualized pelvis intact. Nonobstructive bowel gas pattern. IMPRESSION: Lumbar degenerative spondylosis and associated levoscoliosis. No acute finding by plain radiography Electronically Signed   By: Judie Petit.  Shick M.D.   On: 07/15/2018 10:15   Dg Shoulder Left  Result Date: 07/15/2018 CLINICAL DATA:  62 year old female with left shoulder and hand pain after falling in the grocery store yesterday EXAM: LEFT SHOULDER - 2+ VIEW; LEFT HAND - COMPLETE 3+ VIEW COMPARISON:  Concurrently obtained radiographs of the left shoulder FINDINGS: There is no evidence of fracture or dislocation. There is no evidence of arthropathy or other focal bone abnormality. Soft tissues are unremarkable. IMPRESSION: Negative. Electronically Signed   By: Malachy Moan M.D.   On: 07/15/2018 10:16   Dg Hand Complete Left  Result Date: 07/15/2018 CLINICAL DATA:  62 year old female with left shoulder and hand pain after falling in the grocery store yesterday EXAM: LEFT SHOULDER - 2+ VIEW; LEFT HAND - COMPLETE 3+ VIEW COMPARISON:  Concurrently obtained radiographs of the left shoulder FINDINGS: There is no evidence of fracture or dislocation. There is no evidence of arthropathy or other focal bone abnormality.  Soft tissues are unremarkable. IMPRESSION: Negative. Electronically Signed   By: Malachy Moan M.D.   On: 07/15/2018 10:16    Procedures Procedures (including critical care time)  Medications Ordered in ED Medications  ibuprofen (ADVIL,MOTRIN) tablet 800 mg (800 mg Oral Given 07/15/18 1119)     Initial Impression / Assessment and Plan / ED Course  I have reviewed the triage vital signs and the nursing notes.  Pertinent labs & imaging results that were available during my care of the patient were reviewed by me and considered in my medical decision making (see chart for details).     X-rays unremarkable.  Patient has a contusion to lumbar spine left wrist and left shoulder.  She is given Vicodin to take if Motrin does not help and referred to Ortho  Final Clinical Impressions(s) / ED Diagnoses   Final diagnoses:  Fall, initial encounter    ED Discharge  Orders         Ordered    HYDROcodone-acetaminophen (NORCO/VICODIN) 5-325 MG tablet     07/15/18 1134           Bethann Berkshire, MD 07/15/18 1137

## 2018-07-15 NOTE — Discharge Instructions (Signed)
Follow-up with your doctor or Dr. Romeo Apple if any problems

## 2018-07-23 ENCOUNTER — Telehealth: Payer: Self-pay | Admitting: Orthopaedic Surgery

## 2018-07-23 NOTE — Telephone Encounter (Signed)
Pt called this morning requesting an appointment.  She said she went to the ER on 07/15/18 with a contusion of her lumbar spine and shoulder pain.  She states she had a fall in a store and they would be paying her bills.  I told her that we did not file 3rd party liability and that she would be responsible for the bills from our office.  I told her that we could get her in to see Dr. Hilda Lias on Wednesday but that she would need to pay when she came.  She said she couldn't do that at this time.  She will call us back when she has some funds available.

## 2018-07-30 ENCOUNTER — Telehealth: Payer: Self-pay | Admitting: Orthopaedic Surgery

## 2018-07-30 NOTE — Telephone Encounter (Signed)
Patient called back to schedule appointment with Dr. Hilda LiasKeeling. Because she will have to pay out of pocket she has requested the appointment to be in October. I have her scheduled for 08/16/18 and she was informed that if she didn't have her money then she would be rescheduled. She understands.

## 2018-08-16 ENCOUNTER — Encounter: Payer: Self-pay | Admitting: Orthopaedic Surgery

## 2018-08-16 ENCOUNTER — Ambulatory Visit: Payer: Self-pay | Admitting: Orthopaedic Surgery

## 2018-08-16 VITALS — BP 119/81 | HR 87 | Ht 61.0 in | Wt 155.0 lb

## 2018-08-16 DIAGNOSIS — M5442 Lumbago with sciatica, left side: Secondary | ICD-10-CM

## 2018-08-16 DIAGNOSIS — M25512 Pain in left shoulder: Secondary | ICD-10-CM

## 2018-08-16 MED ORDER — NAPROXEN 500 MG PO TABS: 500.0000 mg | ORAL_TABLET | Freq: Two times a day (BID) | ORAL | 5 refills | Status: DC

## 2018-08-16 NOTE — Progress Notes (Signed)
Subjective:    Patient ID: Jessica Russo, female    DOB: 08/09/1956, 62 y.o.   MRN: [id number]  HPI She fell at a grocery store in [place], [place] on 07-14-18.  She slipped on a wet floor.  She hurt her left shoulder and her lower back.  She went to the ER at [facility] the next day and had X-rays done.  Shoulder x-rays were negative and lumbar x-rays showed: IMPRESSION: Lumbar degenerative spondylosis and associated levoscoliosis. No acute finding by plain radiography  She was told to let her pain slowly subside.  She was given pain medicine.  Since then the back is hurting some with some weakness she says at time of the left leg.  She has no spasm. She has no new trauma. She has no bowel or bladder problems.  She has more pain of the left shoulder with pain past 45 degrees abduction.  She has no numbness, no swelling.  She has used ice and heat with some help.  She is concerned that she is still hurting.  Review of Systems  Constitutional: Positive for activity change.  Musculoskeletal: Positive for arthralgias and back pain.  All other systems reviewed and are negative.  For Review of Systems, all other systems reviewed and are negative.  The following is a summary of the past history medically, past history surgically, known current medicines, social history and family history.  This information is gathered electronically by the computer from prior information and documentation.  I review this each visit and have found including this information at this point in the chart is beneficial and informative.   Past Medical History:  Diagnosis Date  . Arthritis   . History of stomach ulcers   . Hypercholesteremia   . Hypertension     History reviewed. No pertinent surgical history.  Current Outpatient Medications on File Prior to Visit  Medication Sig Dispense Refill  . HYDROcodone-acetaminophen (NORCO/VICODIN) 5-325 MG tablet Take 1 every 6 hours for pain not helped  by 800 mg of Motrin 20 tablet 0  . lisinopril-hydrochlorothiazide (PRINZIDE,ZESTORETIC) 10-12.5 MG tablet Take 1 tablet by mouth daily.    Marland Kitchen lovastatin (MEVACOR) 10 MG tablet Take 10 mg by mouth at bedtime.     No current facility-administered medications on file prior to visit.     Social History   Socioeconomic History  . Marital status: Married    Spouse name: Not on file  . Number of children: Not on file  . Years of education: Not on file  . Highest education level: Not on file  Occupational History  . Not on file  Social Needs  . Financial resource strain: Not on file  . Food insecurity:    Worry: Not on file    Inability: Not on file  . Transportation needs:    Medical: Not on file    Non-medical: Not on file  Tobacco Use  . Smoking status: Never Smoker  . Smokeless tobacco: Never Used  Substance and Sexual Activity  . Alcohol use: No  . Drug use: No  . Sexual activity: Not on file  Lifestyle  . Physical activity:    Days per week: Not on file    Minutes per session: Not on file  . Stress: Not on file  Relationships  . Social connections:    Talks on phone: Not on file    Gets together: Not on file    Attends religious service: Not on file  Active member of club or organization: Not on file    Attends meetings of clubs or organizations: Not on file    Relationship status: Not on file  . Intimate partner violence:    Fear of current or ex partner: Not on file    Emotionally abused: Not on file    Physically abused: Not on file    Forced sexual activity: Not on file  Other Topics Concern  . Not on file  Social History Narrative  . Not on file    Family History  Problem Relation Age of Onset  . Heart failure Father   . Cancer Mother     BP 119/81   Pulse 87   Ht 5\' 1"  (1.549 m)   Wt 155 lb (70.3 kg)   BMI 29.29 kg/m   Body mass index is 29.29 kg/m.     Objective:   Physical Exam  Constitutional: She is oriented to person, place, and  time. She appears well-developed and well-nourished.  HENT:  Head: Normocephalic and atraumatic.  Eyes: Pupils are equal, round, and reactive to light. Conjunctivae and EOM are normal.  Neck: Normal range of motion. Neck supple.  Cardiovascular: Normal rate, regular rhythm and intact distal pulses.  Pulmonary/Chest: Effort normal.  Abdominal: Soft.  Musculoskeletal:       Left shoulder: She exhibits decreased range of motion and tenderness.       Back:       Arms: Neurological: She is alert and oriented to person, place, and time. She has normal reflexes. She displays normal reflexes. No cranial nerve deficit. She exhibits normal muscle tone. Coordination normal.  Skin: Skin is warm and dry.  Psychiatric: She has a normal mood and affect. Her behavior is normal. Judgment and thought content normal.     I have reviewed the x-rays and reports and also the ER records.     Assessment & Plan:   Encounter Diagnoses  Name Primary?  . Acute pain of left shoulder Yes  . Acute left-sided low back pain with left-sided sciatica    PROCEDURE NOTE:  The patient request injection, verbal consent was obtained.  The left shoulder was prepped appropriately after time out was performed.   Sterile technique was observed and injection of 1 cc of Depo-Medrol 40 mg with several cc's of plain xylocaine. Anesthesia was provided by ethyl chloride and a 20-gauge needle was used to inject the shoulder area. A posterior approach was used.  The injection was tolerated well.  A band aid dressing was applied.  The patient was advised to apply ice later today and tomorrow to the injection sight as needed.  I have given Rx for Naprosyn.   I have given sheets of exercises to do.  She asked about PT and I told her to try the exercises at home and see how she does.  Return in two weeks.  Call if any problem.  Precautions discussed.   Electronically Signed Darreld Mclean, MD 10/3/201910:10 AM

## 2018-08-16 NOTE — Patient Instructions (Signed)
Back Exercises If you have pain in your back, do these exercises 2-3 times each day or as told by your doctor. When the pain goes away, do the exercises once each day, but repeat the steps more times for each exercise (do more repetitions). If you do not have pain in your back, do these exercises once each day or as told by your doctor. Exercises Single Knee to Chest  Do these steps 3-5 times in a row for each leg: 1. Lie on your back on a firm bed or the floor with your legs stretched out. 2. Bring one knee to your chest. 3. Hold your knee to your chest by grabbing your knee or thigh. 4. Pull on your knee until you feel a gentle stretch in your lower back. 5. Keep doing the stretch for 10-30 seconds. 6. Slowly let go of your leg and straighten it.  Pelvic Tilt  Do these steps 5-10 times in a row: 1. Lie on your back on a firm bed or the floor with your legs stretched out. 2. Bend your knees so they point up to the ceiling. Your feet should be flat on the floor. 3. Tighten your lower belly (abdomen) muscles to press your lower back against the floor. This will make your tailbone point up to the ceiling instead of pointing down to your feet or the floor. 4. Stay in this position for 5-10 seconds while you gently tighten your muscles and breathe evenly.  Cat-Cow  Do these steps until your lower back bends more easily: 1. Get on your hands and knees on a firm surface. Keep your hands under your shoulders, and keep your knees under your hips. You may put padding under your knees. 2. Let your head hang down, and make your tailbone point down to the floor so your lower back is round like the back of a cat. 3. Stay in this position for 5 seconds. 4. Slowly lift your head and make your tailbone point up to the ceiling so your back hangs low (sags) like the back of a cow. 5. Stay in this position for 5 seconds.  Press-Ups  Do these steps 5-10 times in a row: 1. Lie on your belly (face-down)  on the floor. 2. Place your hands near your head, about shoulder-width apart. 3. While you keep your back relaxed and keep your hips on the floor, slowly straighten your arms to raise the top half of your body and lift your shoulders. Do not use your back muscles. To make yourself more comfortable, you may change where you place your hands. 4. Stay in this position for 5 seconds. 5. Slowly return to lying flat on the floor.  Bridges  Do these steps 10 times in a row: 1. Lie on your back on a firm surface. 2. Bend your knees so they point up to the ceiling. Your feet should be flat on the floor. 3. Tighten your butt muscles and lift your butt off of the floor until your waist is almost as high as your knees. If you do not feel the muscles working in your butt and the back of your thighs, slide your feet 1-2 inches farther away from your butt. 4. Stay in this position for 3-5 seconds. 5. Slowly lower your butt to the floor, and let your butt muscles relax.  If this exercise is too easy, try doing it with your arms crossed over your chest. Belly Crunches  Do these steps 5-10 times in   a row: 1. Lie on your back on a firm bed or the floor with your legs stretched out. 2. Bend your knees so they point up to the ceiling. Your feet should be flat on the floor. 3. Cross your arms over your chest. 4. Tip your chin a little bit toward your chest but do not bend your neck. 5. Tighten your belly muscles and slowly raise your chest just enough to lift your shoulder blades a tiny bit off of the floor. 6. Slowly lower your chest and your head to the floor.  Back Lifts Do these steps 5-10 times in a row: 1. Lie on your belly (face-down) with your arms at your sides, and rest your forehead on the floor. 2. Tighten the muscles in your legs and your butt. 3. Slowly lift your chest off of the floor while you keep your hips on the floor. Keep the back of your head in line with the curve in your back. Look at  the floor while you do this. 4. Stay in this position for 3-5 seconds. 5. Slowly lower your chest and your face to the floor.  Contact a doctor if:  Your back pain gets a lot worse when you do an exercise.  Your back pain does not lessen 2 hours after you exercise. If you have any of these problems, stop doing the exercises. Do not do them again unless your doctor says it is okay. Get help right away if:  You have sudden, very bad back pain. If this happens, stop doing the exercises. Do not do them again unless your doctor says it is okay. This information is not intended to replace advice given to you by your health care provider. Make sure you discuss any questions you have with your health care provider. Document Released: 12/03/2010 Document Revised: 04/07/2016 Document Reviewed: 12/25/2014 [employer] Interactive Patient Education  2018 [employer] Shoulder Exercises Ask your health care provider which exercises are safe for you. Do exercises exactly as told by your health care provider and adjust them as directed. It is normal to feel mild stretching, pulling, tightness, or discomfort as you do these exercises, but you should stop right away if you feel sudden pain or your pain gets worse.Do not begin these exercises until told by your health care provider. RANGE OF MOTION EXERCISES These exercises warm up your muscles and joints and improve the movement and flexibility of your shoulder. These exercises also help to relieve pain, numbness, and tingling. These exercises involve stretching your injured shoulder directly. Exercise A: Pendulum  1. Stand near a wall or a surface that you can hold onto for balance. 2. Bend at the waist and let your left / right arm hang straight down. Use your other arm to support you. Keep your back straight and do not lock your knees. 3. Relax your left / right arm and shoulder muscles, and move your hips and your trunk so your left / right arm swings  freely. Your arm should swing because of the motion of your body, not because you are using your arm or shoulder muscles. 4. Keep moving your body so your arm swings in the following directions, as told by your health care provider: ? Side to side. ? Forward and backward. ? In clockwise and counterclockwise circles. 5. Continue each motion for __________ seconds, or for as long as told by your health care provider. 6. Slowly return to the starting position. Repeat __________ times. Complete this exercise __________   times a day. Exercise B:Flexion, Standing  1. Stand and hold a broomstick, a cane, or a similar object. Place your hands a little more than shoulder-width apart on the object. Your left / right hand should be palm-up, and your other hand should be palm-down. 2. Keep your elbow straight and keep your shoulder muscles relaxed. Push the stick down with your healthy arm to raise your left / right arm in front of your body, and then over your head until you feel a stretch in your shoulder. ? Avoid shrugging your shoulder while you raise your arm. Keep your shoulder blade tucked down toward the middle of your back. 3. Hold for __________ seconds. 4. Slowly return to the starting position. Repeat __________ times. Complete this exercise __________ times a day. Exercise C: Abduction, Standing 1. Stand and hold a broomstick, a cane, or a similar object. Place your hands a little more than shoulder-width apart on the object. Your left / right hand should be palm-up, and your other hand should be palm-down. 2. While keeping your elbow straight and your shoulder muscles relaxed, push the stick across your body toward your left / right side. Raise your left / right arm to the side of your body and then over your head until you feel a stretch in your shoulder. ? Do not raise your arm above shoulder height, unless your health care provider tells you to do that. ? Avoid shrugging your shoulder while  you raise your arm. Keep your shoulder blade tucked down toward the middle of your back. 3. Hold for __________ seconds. 4. Slowly return to the starting position. Repeat __________ times. Complete this exercise __________ times a day. Exercise D:Internal Rotation  1. Place your left / right hand behind your back, palm-up. 2. Use your other hand to dangle an exercise band, a towel, or a similar object over your shoulder. Grasp the band with your left / right hand so you are holding onto both ends. 3. Gently pull up on the band until you feel a stretch in the front of your left / right shoulder. ? Avoid shrugging your shoulder while you raise your arm. Keep your shoulder blade tucked down toward the middle of your back. 4. Hold for __________ seconds. 5. Release the stretch by letting go of the band and lowering your hands. Repeat __________ times. Complete this exercise __________ times a day. STRETCHING EXERCISES These exercises warm up your muscles and joints and improve the movement and flexibility of your shoulder. These exercises also help to relieve pain, numbness, and tingling. These exercises are done using your healthy shoulder to help stretch the muscles of your injured shoulder. Exercise E: Corner Stretch (External Rotation and Abduction)  1. Stand in a doorway with one of your feet slightly in front of the other. This is called a staggered stance. If you cannot reach your forearms to the door frame, stand facing a corner of a room. 2. Choose one of the following positions as told by your health care provider: ? Place your hands and forearms on the door frame above your head. ? Place your hands and forearms on the door frame at the height of your head. ? Place your hands on the door frame at the height of your elbows. 3. Slowly move your weight onto your front foot until you feel a stretch across your chest and in the front of your shoulders. Keep your head and chest upright and  keep your abdominal muscles tight. 4.   Hold for __________ seconds. 5. To release the stretch, shift your weight to your back foot. Repeat __________ times. Complete this stretch __________ times a day. Exercise F:Extension, Standing 1. Stand and hold a broomstick, a cane, or a similar object behind your back. ? Your hands should be a little wider than shoulder-width apart. ? Your palms should face away from your back. 2. Keeping your elbows straight and keeping your shoulder muscles relaxed, move the stick away from your body until you feel a stretch in your shoulder. ? Avoid shrugging your shoulders while you move the stick. Keep your shoulder blade tucked down toward the middle of your back. 3. Hold for __________ seconds. 4. Slowly return to the starting position. Repeat __________ times. Complete this exercise __________ times a day. STRENGTHENING EXERCISES These exercises build strength and endurance in your shoulder. Endurance is the ability to use your muscles for a long time, even after they get tired. Exercise G:External Rotation  1. Sit in a stable chair without armrests. 2. Secure an exercise band at elbow height on your left / right side. 3. Place a soft object, such as a folded towel or a small pillow, between your left / right upper arm and your body to move your elbow a few inches away (about 10 cm) from your side. 4. Hold the end of the band so it is tight and there is no slack. 5. Keeping your elbow pressed against the soft object, move your left / right forearm out, away from your abdomen. Keep your body steady so only your forearm moves. 6. Hold for __________ seconds. 7. Slowly return to the starting position. Repeat __________ times. Complete this exercise __________ times a day. Exercise H:Shoulder Abduction  1. Sit in a stable chair without armrests, or stand. 2. Hold a __________ weight in your left / right hand, or hold an exercise band with both  hands. 3. Start with your arms straight down and your left / right palm facing in, toward your body. 4. Slowly lift your left / right hand out to your side. Do not lift your hand above shoulder height unless your health care provider tells you that this is safe. ? Keep your arms straight. ? Avoid shrugging your shoulder while you do this movement. Keep your shoulder blade tucked down toward the middle of your back. 5. Hold for __________ seconds. 6. Slowly lower your arm, and return to the starting position. Repeat __________ times. Complete this exercise __________ times a day. Exercise I:Shoulder Extension 1. Sit in a stable chair without armrests, or stand. 2. Secure an exercise band to a stable object in front of you where it is at shoulder height. 3. Hold one end of the exercise band in each hand. Your palms should face each other. 4. Straighten your elbows and lift your hands up to shoulder height. 5. Step back, away from the secured end of the exercise band, until the band is tight and there is no slack. 6. Squeeze your shoulder blades together as you pull your hands down to the sides of your thighs. Stop when your hands are straight down by your sides. Do not let your hands go behind your body. 7. Hold for __________ seconds. 8. Slowly return to the starting position. Repeat __________ times. Complete this exercise __________ times a day. Exercise J:Standing Shoulder Row 1. Sit in a stable chair without armrests, or stand. 2. Secure an exercise band to a stable object in front of you so it   is at waist height. 3. Hold one end of the exercise band in each hand. Your palms should be in a thumbs-up position. 4. Bend each of your elbows to an "L" shape (about 90 degrees) and keep your upper arms at your sides. 5. Step back until the band is tight and there is no slack. 6. Slowly pull your elbows back behind you. 7. Hold for __________ seconds. 8. Slowly return to the starting  position. Repeat __________ times. Complete this exercise __________ times a day. Exercise K:Shoulder Press-Ups  1. Sit in a stable chair that has armrests. Sit upright, with your feet flat on the floor. 2. Put your hands on the armrests so your elbows are bent and your fingers are pointing forward. Your hands should be about even with the sides of your body. 3. Push down on the armrests and use your arms to lift yourself off of the chair. Straighten your elbows and lift yourself up as much as you comfortably can. ? Move your shoulder blades down, and avoid letting your shoulders move up toward your ears. ? Keep your feet on the ground. As you get stronger, your feet should support less of your body weight as you lift yourself up. 4. Hold for __________ seconds. 5. Slowly lower yourself back into the chair. Repeat __________ times. Complete this exercise __________ times a day. Exercise L: Wall Push-Ups  1. Stand so you are facing a stable wall. Your feet should be about one arm-length away from the wall. 2. Lean forward and place your palms on the wall at shoulder height. 3. Keep your feet flat on the floor as you bend your elbows and lean forward toward the wall. 4. Hold for __________ seconds. 5. Straighten your elbows to push yourself back to the starting position. Repeat __________ times. Complete this exercise __________ times a day. This information is not intended to replace advice given to you by your health care provider. Make sure you discuss any questions you have with your health care provider. Document Released: 09/14/2005 Document Revised: 07/25/2016 Document Reviewed: 07/12/2015 [employer] Interactive Patient Education  2018 [employer]  

## 2018-08-30 ENCOUNTER — Encounter: Payer: Self-pay | Admitting: Orthopaedic Surgery

## 2018-08-30 ENCOUNTER — Ambulatory Visit (INDEPENDENT_AMBULATORY_CARE_PROVIDER_SITE_OTHER): Payer: Self-pay | Admitting: Orthopaedic Surgery

## 2018-08-30 VITALS — BP 117/74 | HR 84 | Ht 61.0 in | Wt 156.0 lb

## 2018-08-30 DIAGNOSIS — M25512 Pain in left shoulder: Secondary | ICD-10-CM

## 2018-08-30 DIAGNOSIS — G8911 Acute pain due to trauma: Secondary | ICD-10-CM

## 2018-08-30 DIAGNOSIS — M5442 Lumbago with sciatica, left side: Secondary | ICD-10-CM

## 2018-08-30 NOTE — Progress Notes (Signed)
Patient ZO:XWRUE MALIKA Russo, female DOB:Jun 16, 1956, 62 y.o. AVW:[id number]  Chief Complaint  Patient presents with  . Shoulder Pain    left  . Back Pain  . Leg Pain    left leg foot numb     HPI  Jessica Russo is a 62 y.o. female who has left shoulder pain that is improved.  The naprosyn helped. She has been doing her exercises.  She has no new trauma.  Her lower back is worse.  She has left sided sciatica and numbness to the lateral left foot.  She has no weakness.  She has no new trauma.  I will get a MRI of the lumbar spine.   Body mass index is 29.48 kg/m.  ROS  Review of Systems  Constitutional: Positive for activity change.  Musculoskeletal: Positive for arthralgias and back pain.  All other systems reviewed and are negative.   All other systems reviewed and are negative.  The following is a summary of the past history medically, past history surgically, known current medicines, social history and family history.  This information is gathered electronically by the computer from prior information and documentation.  I review this each visit and have found including this information at this point in the chart is beneficial and informative.    Past Medical History:  Diagnosis Date  . Arthritis   . History of stomach ulcers   . Hypercholesteremia   . Hypertension     History reviewed. No pertinent surgical history.  Family History  Problem Relation Age of Onset  . Heart failure Father   . Cancer Mother     Social History Social History   Tobacco Use  . Smoking status: Never Smoker  . Smokeless tobacco: Never Used  Substance Use Topics  . Alcohol use: No  . Drug use: No    No Known Allergies  Current Outpatient Medications  Medication Sig Dispense Refill  . HYDROcodone-acetaminophen (NORCO/VICODIN) 5-325 MG tablet Take 1 every 6 hours for pain not helped by 800 mg of Motrin 20 tablet 0  . lisinopril-hydrochlorothiazide (PRINZIDE,ZESTORETIC) 10-12.5 MG  tablet Take 1 tablet by mouth daily.    Marland Kitchen lovastatin (MEVACOR) 10 MG tablet Take 10 mg by mouth at bedtime.    . naproxen (NAPROSYN) 500 MG tablet Take 1 tablet (500 mg total) by mouth 2 (two) times daily with a meal. 60 tablet 5   No current facility-administered medications for this visit.      Physical Exam  Blood pressure 117/74, pulse 84, height 5\' 1"  (1.549 m), weight 156 lb (70.8 kg).  Constitutional: overall normal hygiene, normal nutrition, well developed, normal grooming, normal body habitus. Assistive device:none  Musculoskeletal: gait and station Limp none, muscle tone and strength are normal, no tremors or atrophy is present.  .  Neurological: coordination overall normal.  Deep tendon reflex/nerve stretch intact.  Sensation normal.  Cranial nerves II-XII intact.   Skin:   Normal overall no scars, lesions, ulcers or rashes. No psoriasis.  Psychiatric: Alert and oriented x 3.  Recent memory intact, remote memory unclear.  Normal mood and affect. Well groomed.  Good eye contact.  Cardiovascular: overall no swelling, no varicosities, no edema bilaterally, normal temperatures of the legs and arms, no clubbing, cyanosis and good capillary refill.  Lymphatic: palpation is normal.  Left shoulder with full ROM.  NV intact.  Spine/Pelvis examination:  Inspection:  Overall, sacoiliac joint benign and hips nontender; without crepitus or defects.   Thoracic spine inspection: Alignment normal  without kyphosis present   Lumbar spine inspection:  Alignment  with normal lumbar lordosis, without scoliosis apparent.   Thoracic spine palpation:  without tenderness of spinal processes   Lumbar spine palpation: without tenderness of lumbar area; without tightness of lumbar muscles    Range of Motion:   Lumbar flexion, forward flexion is normal without pain or tenderness    Lumbar extension is full without pain or tenderness   Left lateral bend is normal without pain or  tenderness   Right lateral bend is normal without pain or tenderness   Straight leg raising is normal  Strength & tone: normal   Stability overall normal stability All other systems reviewed and are negative   The patient has been educated about the nature of the problem(s) and counseled on treatment options.  The patient appeared to understand what I have discussed and is in agreement with it.  Encounter Diagnoses  Name Primary?  . Acute pain of left shoulder Yes  . Acute left-sided low back pain with left-sided sciatica     PLAN Call if any problems.  Precautions discussed.  Continue current medications.   Return to clinic after MRI of the lumbar spine.   Electronically Signed Darreld Mclean, MD 10/17/20199:08 AM

## 2018-09-05 ENCOUNTER — Ambulatory Visit (HOSPITAL_COMMUNITY)
Admission: RE | Admit: 2018-09-05 | Discharge: 2018-09-05 | Disposition: A | Discharge status: Home / Self Care | Payer: Self-pay | Source: Ambulatory Visit | Attending: Orthopaedic Surgery | Admitting: Orthopaedic Surgery

## 2018-09-05 DIAGNOSIS — M47896 Other spondylosis, lumbar region: Secondary | ICD-10-CM | POA: Insufficient documentation

## 2018-09-05 DIAGNOSIS — G8911 Acute pain due to trauma: Secondary | ICD-10-CM | POA: Insufficient documentation

## 2018-09-05 DIAGNOSIS — M48061 Spinal stenosis, lumbar region without neurogenic claudication: Secondary | ICD-10-CM | POA: Insufficient documentation

## 2018-09-05 DIAGNOSIS — W19XXXA Unspecified fall, initial encounter: Secondary | ICD-10-CM | POA: Insufficient documentation

## 2018-09-11 ENCOUNTER — Encounter: Payer: Self-pay | Admitting: Orthopaedic Surgery

## 2018-09-11 ENCOUNTER — Ambulatory Visit (INDEPENDENT_AMBULATORY_CARE_PROVIDER_SITE_OTHER): Payer: Self-pay | Admitting: Orthopaedic Surgery

## 2018-09-11 VITALS — BP 122/82 | HR 79 | Ht 61.0 in | Wt 156.0 lb

## 2018-09-11 DIAGNOSIS — M5442 Lumbago with sciatica, left side: Secondary | ICD-10-CM

## 2018-09-11 NOTE — Progress Notes (Signed)
Patient Jessica Russo, female DOB:1956/09/13, 62 y.o. AVW:[id number]  Chief Complaint  Patient presents with  . Shoulder Pain    left  . Back Pain  . Results    review lumbar MRI     HPI  Jessica Russo is a 62 y.o. female who has lower back pain and left sided sciatica.  She says she has numbness still to the left lower leg.  The MRI showed: IMPRESSION: 1. Mild multilevel degenerative changes of the lumbar spine as described above with mild multilevel lateral recess and neuroforaminal stenosis. No high-grade stenosis or clear nerve Impingement.  I have recommended therapy.  She does not need any surgery for the back.  I have explained the findings to her.   Body mass index is 29.48 kg/m.  ROS  Review of Systems  Constitutional: Positive for activity change.  Musculoskeletal: Positive for arthralgias and back pain.  All other systems reviewed and are negative.   All other systems reviewed and are negative.  The following is a summary of the past history medically, past history surgically, known current medicines, social history and family history.  This information is gathered electronically by the computer from prior information and documentation.  I review this each visit and have found including this information at this point in the chart is beneficial and informative.    Past Medical History:  Diagnosis Date  . Arthritis   . History of stomach ulcers   . Hypercholesteremia   . Hypertension     History reviewed. No pertinent surgical history.  Family History  Problem Relation Age of Onset  . Heart failure Father   . Cancer Mother     Social History Social History   Tobacco Use  . Smoking status: Never Smoker  . Smokeless tobacco: Never Used  Substance Use Topics  . Alcohol use: No  . Drug use: No    No Known Allergies  Current Outpatient Medications  Medication Sig Dispense Refill  . HYDROcodone-acetaminophen (NORCO/VICODIN) 5-325 MG tablet  Take 1 every 6 hours for pain not helped by 800 mg of Motrin 20 tablet 0  . lisinopril-hydrochlorothiazide (PRINZIDE,ZESTORETIC) 10-12.5 MG tablet Take 1 tablet by mouth daily.    Marland Kitchen lovastatin (MEVACOR) 10 MG tablet Take 10 mg by mouth at bedtime.    . naproxen (NAPROSYN) 500 MG tablet Take 1 tablet (500 mg total) by mouth 2 (two) times daily with a meal. 60 tablet 5   No current facility-administered medications for this visit.      Physical Exam  Blood pressure 122/82, pulse 79, height 5\' 1"  (1.549 m), weight 156 lb (70.8 kg).  Constitutional: overall normal hygiene, normal nutrition, well developed, normal grooming, normal body habitus. Assistive device:none  Musculoskeletal: gait and station Limp none, muscle tone and strength are normal, no tremors or atrophy is present.  .  Neurological: coordination overall normal.  Deep tendon reflex/nerve stretch intact.  Sensation normal.  Cranial nerves II-XII intact.   Skin:   Normal overall no scars, lesions, ulcers or rashes. No psoriasis.  Psychiatric: Alert and oriented x 3.  Recent memory intact, remote memory unclear.  Normal mood and affect. Well groomed.  Good eye contact.  Cardiovascular: overall no swelling, no varicosities, no edema bilaterally, normal temperatures of the legs and arms, no clubbing, cyanosis and good capillary refill.  Lymphatic: palpation is normal.  Spine/Pelvis examination:  Inspection:  Overall, sacoiliac joint benign and hips nontender; without crepitus or defects.   Thoracic spine inspection: Alignment  normal without kyphosis present   Lumbar spine inspection:  Alignment  with normal lumbar lordosis, without scoliosis apparent.   Thoracic spine palpation:  without tenderness of spinal processes   Lumbar spine palpation: without tenderness of lumbar area; without tightness of lumbar muscles    Range of Motion:   Lumbar flexion, forward flexion is normal without pain or tenderness    Lumbar  extension is full without pain or tenderness   Left lateral bend is normal without pain or tenderness   Right lateral bend is normal without pain or tenderness   Straight leg raising is normal  Strength & tone: normal   Stability overall normal stability All other systems reviewed and are negative   The patient has been educated about the nature of the problem(s) and counseled on treatment options.  The patient appeared to understand what I have discussed and is in agreement with it.  Encounter Diagnosis  Name Primary?  . Acute left-sided low back pain with left-sided sciatica Yes    PLAN Call if any problems.  Precautions discussed.  Continue current medications.   Return to clinic 1 month   Begin PT.  Electronically Signed Darreld Mclean, MD 10/29/20199:30 AM

## 2018-09-12 ENCOUNTER — Telehealth: Payer: Self-pay | Admitting: Orthopaedic Surgery

## 2018-09-12 NOTE — Telephone Encounter (Signed)
Patient called stating she has an appointment scheduled for next Tuesday at [facility] and was told to ask our office to send them office notes for this patient.   They gave her this fax # [phone] for [place].

## 2018-09-25 ENCOUNTER — Ambulatory Visit: Payer: Self-pay | Admitting: Orthopaedic Surgery

## 2018-09-26 ENCOUNTER — Ambulatory Visit (INDEPENDENT_AMBULATORY_CARE_PROVIDER_SITE_OTHER): Payer: Self-pay | Admitting: Orthopaedic Surgery

## 2018-09-26 ENCOUNTER — Encounter: Payer: Self-pay | Admitting: Orthopaedic Surgery

## 2018-09-26 VITALS — BP 140/83 | HR 86 | Ht 61.0 in | Wt 156.0 lb

## 2018-09-26 DIAGNOSIS — M5442 Lumbago with sciatica, left side: Secondary | ICD-10-CM

## 2018-09-26 NOTE — Progress Notes (Signed)
Patient ZO:XWRUE:Jessica Russo, female DOB:06-21-1956, 62 y.o. AVW:[id number]  Chief Complaint  Patient presents with  . Back Pain    low back pain with left side sciatica. Numbness left lower leg and foot.    HPI  Jessica Russo is a 62 y.o. female who has lower back pain.  She has some left sided sciatica. She is going to PT in [place] and is better.  I do not have copies of their notes.  She has been three times.  She is moving better and has less pain.  She still hurts but is improved.  She has no weakness.   Body mass index is 29.48 kg/m.  ROS  Review of Systems  Constitutional: Positive for activity change.  Musculoskeletal: Positive for arthralgias and back pain.  All other systems reviewed and are negative.   All other systems reviewed and are negative.  The following is a summary of the past history medically, past history surgically, known current medicines, social history and family history.  This information is gathered electronically by the computer from prior information and documentation.  I review this each visit and have found including this information at this point in the chart is beneficial and informative.    Past Medical History:  Diagnosis Date  . Arthritis   . History of stomach ulcers   . Hypercholesteremia   . Hypertension     History reviewed. No pertinent surgical history.  Family History  Problem Relation Age of Onset  . Heart failure Father   . Cancer Mother     Social History Social History   Tobacco Use  . Smoking status: Never Smoker  . Smokeless tobacco: Never Used  Substance Use Topics  . Alcohol use: No  . Drug use: No    No Known Allergies  Current Outpatient Medications  Medication Sig Dispense Refill  . HYDROcodone-acetaminophen (NORCO/VICODIN) 5-325 MG tablet Take 1 every 6 hours for pain not helped by 800 mg of Motrin 20 tablet 0  . lisinopril-hydrochlorothiazide (PRINZIDE,ZESTORETIC) 10-12.5 MG tablet Take 1 tablet by  mouth daily.    Marland Kitchen. lovastatin (MEVACOR) 10 MG tablet Take 10 mg by mouth at bedtime.    . naproxen (NAPROSYN) 500 MG tablet Take 1 tablet (500 mg total) by mouth 2 (two) times daily with a meal. 60 tablet 5   No current facility-administered medications for this visit.      Physical Exam  Blood pressure 140/83, pulse 86, height 5\' 1"  (1.549 m), weight 156 lb (70.8 kg).  Constitutional: overall normal hygiene, normal nutrition, well developed, normal grooming, normal body habitus. Assistive device:none  Musculoskeletal: gait and station Limp none, muscle tone and strength are normal, no tremors or atrophy is present.  .  Neurological: coordination overall normal.  Deep tendon reflex/nerve stretch intact.  Sensation normal.  Cranial nerves II-XII intact.   Skin:   Normal overall no scars, lesions, ulcers or rashes. No psoriasis.  Psychiatric: Alert and oriented x 3.  Recent memory intact, remote memory unclear.  Normal mood and affect. Well groomed.  Good eye contact.  Cardiovascular: overall no swelling, no varicosities, no edema bilaterally, normal temperatures of the legs and arms, no clubbing, cyanosis and good capillary refill.  Lymphatic: palpation is normal.   Spine/Pelvis examination:  Inspection:  Overall, sacoiliac joint benign and hips nontender; without crepitus or defects.   Thoracic spine inspection: Alignment normal without kyphosis present   Lumbar spine inspection:  Alignment  with normal lumbar lordosis, without scoliosis apparent.  Thoracic spine palpation:  without tenderness of spinal processes   Lumbar spine palpation: without tenderness of lumbar area; without tightness of lumbar muscles    Range of Motion:   Lumbar flexion, forward flexion is 35 degrees without pain or tenderness    Lumbar extension is full without pain or tenderness   Left lateral bend is normal without pain or tenderness   Right lateral bend is normal without pain or  tenderness   Straight leg raising is normal  Strength & tone: normal   Stability overall normal stability All other systems reviewed and are negative   The patient has been educated about the nature of the problem(s) and counseled on treatment options.  The patient appeared to understand what I have discussed and is in agreement with it.  Encounter Diagnosis  Name Primary?  . Acute left-sided low back pain with left-sided sciatica Yes    PLAN Call if any problems.  Precautions discussed.  Continue current medications.   Return to clinic 1 month   Continue PT.  Electronically Signed Darreld Mclean, MD 11/13/20192:21 PM

## 2018-10-09 ENCOUNTER — Encounter (HOSPITAL_COMMUNITY): Payer: Self-pay | Admitting: Emergency Medicine

## 2018-10-09 ENCOUNTER — Other Ambulatory Visit: Payer: Self-pay

## 2018-10-09 ENCOUNTER — Emergency Department (HOSPITAL_COMMUNITY)
Admission: EM | Admit: 2018-10-09 | Discharge: 2018-10-09 | Disposition: A | Discharge status: Home / Self Care | Payer: Self-pay | Attending: Emergency Medicine | Admitting: Emergency Medicine

## 2018-10-09 DIAGNOSIS — K029 Dental caries, unspecified: Secondary | ICD-10-CM | POA: Insufficient documentation

## 2018-10-09 DIAGNOSIS — K148 Other diseases of tongue: Secondary | ICD-10-CM | POA: Insufficient documentation

## 2018-10-09 DIAGNOSIS — Z79899 Other long term (current) drug therapy: Secondary | ICD-10-CM | POA: Insufficient documentation

## 2018-10-09 DIAGNOSIS — I10 Essential (primary) hypertension: Secondary | ICD-10-CM | POA: Insufficient documentation

## 2018-10-09 DIAGNOSIS — K140 Glossitis: Secondary | ICD-10-CM

## 2018-10-09 MED ORDER — AMOXICILLIN 250 MG PO CAPS
500.0000 mg | ORAL_CAPSULE | Freq: Once | ORAL | Status: AC
  Administered 2018-10-09: 500 mg via ORAL
  Filled 2018-10-09: qty 2

## 2018-10-09 MED ORDER — ACETAMINOPHEN 500 MG PO TABS
1000.0000 mg | ORAL_TABLET | Freq: Once | ORAL | Status: AC
  Administered 2018-10-09: 1000 mg via ORAL
  Filled 2018-10-09: qty 2

## 2018-10-09 MED ORDER — AMOXICILLIN 500 MG PO CAPS: 500.0000 mg | ORAL_CAPSULE | Freq: Three times a day (TID) | ORAL | 0 refills | Status: DC

## 2018-10-09 MED ORDER — TRAMADOL HCL 50 MG PO TABS: ORAL_TABLET | ORAL | 0 refills | Status: DC

## 2018-10-09 NOTE — Discharge Instructions (Addendum)
Please use warm salt water swishes for your tongue laceration and infection.  Please use Amoxil 3 times daily with food.  Use Tylenol extra strength every 4 hours for mild pain, use Ultram every 6 hours for more severe pain. This medication may cause drowsiness. Please do not drink, drive, or participate in activity that requires concentration while taking this medication.  It is important that you see a dentist as soon as possible.  Please return to the emergency department if any advancement in your infection, changes in your condition, problems, or concerns.

## 2018-10-09 NOTE — ED Notes (Signed)
ED Provider at bedside. 

## 2018-10-09 NOTE — ED Triage Notes (Signed)
Patient complaining of lower left dental pain x 2 weeks.

## 2018-10-09 NOTE — ED Provider Notes (Signed)
[facility] EMERGENCY DEPARTMENT Provider Note   CSN: [id number] Arrival date & time: 10/09/18  1035     History   Chief Complaint Chief Complaint  Patient presents with  . Dental Pain    HPI Jessica Russo is a 62 y.o. female.  The history is provided by the patient.  Dental Pain   This is a new problem. The current episode started more than 1 week ago. The problem occurs constantly. The problem has been gradually worsening. The pain is moderate. Treatments tried: salt water swish.    Past Medical History:  Diagnosis Date  . Arthritis   . History of stomach ulcers   . Hypercholesteremia   . Hypertension     There are no active problems to display for this patient.   History reviewed. No pertinent surgical history.   OB History    Gravida  2   Para  2   Term  2   Preterm      AB      Living  2     SAB      TAB      Ectopic      Multiple      Live Births               Home Medications    Prior to Admission medications   Medication Sig Start Date End Date Taking? Authorizing Provider  HYDROcodone-acetaminophen (NORCO/VICODIN) 5-325 MG tablet Take 1 every 6 hours for pain not helped by 800 mg of Motrin 07/15/18   Bethann BerkshireZammit, Joseph, MD  lisinopril-hydrochlorothiazide (PRINZIDE,ZESTORETIC) 10-12.5 MG tablet Take 1 tablet by mouth daily.    [provider]  lovastatin (MEVACOR) 10 MG tablet Take 10 mg by mouth at bedtime.    [provider]  naproxen (NAPROSYN) 500 MG tablet Take 1 tablet (500 mg total) by mouth 2 (two) times daily with a meal. 08/16/18   Darreld McleanKeeling, Wayne, MD    Family History Family History  Problem Relation Age of Onset  . Heart failure Father   . Cancer Mother     Social History Social History   Tobacco Use  . Smoking status: Never Smoker  . Smokeless tobacco: Never Used  Substance Use Topics  . Alcohol use: No  . Drug use: No     Allergies   Patient has no known allergies.   Review of  Systems Review of Systems  Constitutional: Negative for activity change and fever.       All ROS Neg except as noted in HPI  HENT: Positive for dental problem. Negative for nosebleeds.        Tongue pain  Eyes: Negative for photophobia and discharge.  Respiratory: Negative for cough, shortness of breath and wheezing.   Cardiovascular: Negative for chest pain and palpitations.  Gastrointestinal: Negative for abdominal pain and blood in stool.  Genitourinary: Negative for dysuria, frequency and hematuria.  Musculoskeletal: Negative for arthralgias, back pain and neck pain.  Skin: Negative.   Neurological: Negative for dizziness, seizures and speech difficulty.  Psychiatric/Behavioral: Negative for confusion and hallucinations.     Physical Exam Updated Vital Signs BP 128/79 (BP Location: Right Arm)   Pulse 73   Temp 97.9 F (36.6 C) (Oral)   Resp 16   Ht 5\' 1"  (1.549 m)   Wt 68 kg   SpO2 98%   BMI 28.34 kg/m   Physical Exam  Constitutional: She is oriented to person, place, and time. She appears well-developed and  well-nourished.  Non-toxic appearance.  HENT:  Head: Normocephalic.  Right Ear: Tympanic membrane and external ear normal.  Left Ear: Tympanic membrane and external ear normal.  Mouth/Throat: Uvula is midline and mucous membranes are normal. No trismus in the jaw. Lacerations and dental caries present. No uvula swelling.    MUltiple dental caries, left greater than right. Infected laceration of the left tongue with mild swelling. No Trismus. No drooling,   Eyes: Pupils are equal, round, and reactive to light. EOM and lids are normal.  Neck: Normal range of motion. Neck supple. Carotid bruit is not present.  Cardiovascular: Normal rate, regular rhythm, normal heart sounds, intact distal pulses and normal pulses.  Pulmonary/Chest: Breath sounds normal. No respiratory distress.  Abdominal: Soft. Bowel sounds are normal. There is no tenderness. There is no guarding.   Musculoskeletal: Normal range of motion.  Lymphadenopathy:       Head (right side): No submandibular adenopathy present.       Head (left side): No submandibular adenopathy present.    She has no cervical adenopathy.  Neurological: She is alert and oriented to person, place, and time. She has normal strength. No cranial nerve deficit or sensory deficit.  Skin: Skin is warm and dry.  Psychiatric: She has a normal mood and affect. Her speech is normal.  Nursing note and vitals reviewed.    ED Treatments / Results  Labs (all labs ordered are listed, but only abnormal results are displayed) Labs Reviewed - No data to display  EKG None  Radiology No results found.  Procedures Procedures (including critical care time)  Medications Ordered in ED Medications - No data to display   Initial Impression / Assessment and Plan / ED Course  I have reviewed the triage vital signs and the nursing notes.  Pertinent labs & imaging results that were available during my care of the patient were reviewed by me and considered in my medical decision making (see chart for details).      Final Clinical Impressions(s) / ED Diagnose MDM  Vital signs are within normal limits.  Patient has laceration to the left side of the tongue, and multiple dental caries present.  No evidence for Ludewig's angina. Patient was treated with antibiotics and medication for assistance with pain.  Of asked the patient to see a dentist as soon as possible.  Have asked patient to return to the emergency department immediately if the swelling in the tongue worsens, difficulty with speaking or swallowing, changes in condition, problems or concerns.   Final diagnoses:  Tongue infection  Dental caries    ED Discharge Orders         Ordered    traMADol (ULTRAM) 50 MG tablet     10/09/18 1226    amoxicillin (AMOXIL) 500 MG capsule  3 times daily     10/09/18 1226           Ivery Quale, PA-C 10/09/18  1707    Donnetta Hutching, MD 10/10/18 [id number]

## 2018-10-23 ENCOUNTER — Ambulatory Visit: Payer: Self-pay | Admitting: Orthopaedic Surgery

## 2018-10-24 ENCOUNTER — Ambulatory Visit (INDEPENDENT_AMBULATORY_CARE_PROVIDER_SITE_OTHER): Payer: Self-pay | Admitting: Orthopaedic Surgery

## 2018-10-24 ENCOUNTER — Encounter: Payer: Self-pay | Admitting: Orthopaedic Surgery

## 2018-10-24 ENCOUNTER — Emergency Department (HOSPITAL_COMMUNITY): Admission: EM | Admit: 2018-10-24 | Discharge: 2018-10-24 | Discharge status: Against Medical Advice | Payer: Self-pay

## 2018-10-24 VITALS — BP 118/78 | HR 84 | Ht 61.0 in | Wt 150.0 lb

## 2018-10-24 DIAGNOSIS — G8929 Other chronic pain: Secondary | ICD-10-CM

## 2018-10-24 DIAGNOSIS — M5442 Lumbago with sciatica, left side: Secondary | ICD-10-CM

## 2018-10-24 NOTE — Progress Notes (Signed)
Patient ZO:XWRUE Jessica Russo, female DOB:1956-06-24, 62 y.o. AVW:[id number]  Chief Complaint  Patient presents with  . Back Pain    HPI  Jessica Russo is a 62 y.o. female who has lower back pain.  She has been to PT in [place] and has improved.  She still has some numbness on the left leg at times.  She has no new trauma. She is doing her exercises.  PT will discharge her tomorrow.  She is taking her medicine.   Body mass index is 28.34 kg/m.  ROS  Review of Systems  Constitutional: Positive for activity change.  Musculoskeletal: Positive for arthralgias and back pain.  All other systems reviewed and are negative.   All other systems reviewed and are negative.  The following is a summary of the past history medically, past history surgically, known current medicines, social history and family history.  This information is gathered electronically by the computer from prior information and documentation.  I review this each visit and have found including this information at this point in the chart is beneficial and informative.    Past Medical History:  Diagnosis Date  . Arthritis   . History of stomach ulcers   . Hypercholesteremia   . Hypertension     History reviewed. No pertinent surgical history.  Family History  Problem Relation Age of Onset  . Heart failure Father   . Cancer Mother     Social History Social History   Tobacco Use  . Smoking status: Never Smoker  . Smokeless tobacco: Never Used  Substance Use Topics  . Alcohol use: No  . Drug use: No    No Known Allergies  Current Outpatient Medications  Medication Sig Dispense Refill  . amoxicillin (AMOXIL) 500 MG capsule Take 1 capsule (500 mg total) by mouth 3 (three) times daily. 21 capsule 0  . HYDROcodone-acetaminophen (NORCO/VICODIN) 5-325 MG tablet Take 1 every 6 hours for pain not helped by 800 mg of Motrin 20 tablet 0  . lisinopril-hydrochlorothiazide (PRINZIDE,ZESTORETIC) 10-12.5 MG tablet  Take 1 tablet by mouth daily.    Marland Kitchen lovastatin (MEVACOR) 10 MG tablet Take 10 mg by mouth at bedtime.    . naproxen (NAPROSYN) 500 MG tablet Take 1 tablet (500 mg total) by mouth 2 (two) times daily with a meal. 60 tablet 5  . traMADol (ULTRAM) 50 MG tablet 1 or 2 po q6h prn pain 12 tablet 0   No current facility-administered medications for this visit.      Physical Exam  Blood pressure 118/78, pulse 84, height 5\' 1"  (1.549 m), weight 150 lb (68 kg).  Constitutional: overall normal hygiene, normal nutrition, well developed, normal grooming, normal body habitus. Assistive device:none  Musculoskeletal: gait and station Limp none, muscle tone and strength are normal, no tremors or atrophy is present.  .  Neurological: coordination overall normal.  Deep tendon reflex/nerve stretch intact.  Sensation normal.  Cranial nerves II-XII intact.   Skin:   Normal overall no scars, lesions, ulcers or rashes. No psoriasis.  Psychiatric: Alert and oriented x 3.  Recent memory intact, remote memory unclear.  Normal mood and affect. Well groomed.  Good eye contact.  Cardiovascular: overall no swelling, no varicosities, no edema bilaterally, normal temperatures of the legs and arms, no clubbing, cyanosis and good capillary refill.  Lymphatic: palpation is normal.  Spine/Pelvis examination:  Inspection:  Overall, sacoiliac joint benign and hips nontender; without crepitus or defects.   Thoracic spine inspection: Alignment normal without kyphosis present  Lumbar spine inspection:  Alignment  with normal lumbar lordosis, without scoliosis apparent.   Thoracic spine palpation:  without tenderness of spinal processes   Lumbar spine palpation: without tenderness of lumbar area; without tightness of lumbar muscles    Range of Motion:   Lumbar flexion, forward flexion is normal without pain or tenderness    Lumbar extension is full without pain or tenderness   Left lateral bend is normal without pain  or tenderness   Right lateral bend is normal without pain or tenderness   Straight leg raising is normal  Strength & tone: normal   Stability overall normal stability  All other systems reviewed and are negative   The patient has been educated about the nature of the problem(s) and counseled on treatment options.  The patient appeared to understand what I have discussed and is in agreement with it.  Encounter Diagnosis  Name Primary?  . Chronic left-sided low back pain with left-sided sciatica Yes    PLAN Call if any problems.  Precautions discussed.  Continue current medications.   Return to clinic prn   Electronically Signed Darreld McleanWayne Ashira Kirsten, MD 12/11/20191:49 PM

## 2018-10-27 ENCOUNTER — Other Ambulatory Visit: Payer: Self-pay

## 2018-10-27 ENCOUNTER — Encounter (HOSPITAL_COMMUNITY): Payer: Self-pay | Admitting: Emergency Medicine

## 2018-10-27 ENCOUNTER — Emergency Department (HOSPITAL_COMMUNITY)
Admission: EM | Admit: 2018-10-27 | Discharge: 2018-10-27 | Disposition: A | Discharge status: Home / Self Care | Payer: Self-pay | Attending: Emergency Medicine | Admitting: Emergency Medicine

## 2018-10-27 DIAGNOSIS — K146 Glossodynia: Secondary | ICD-10-CM | POA: Insufficient documentation

## 2018-10-27 DIAGNOSIS — Z79899 Other long term (current) drug therapy: Secondary | ICD-10-CM | POA: Insufficient documentation

## 2018-10-27 DIAGNOSIS — I10 Essential (primary) hypertension: Secondary | ICD-10-CM | POA: Insufficient documentation

## 2018-10-27 MED ORDER — AMOXICILLIN 500 MG PO CAPS: 500.0000 mg | ORAL_CAPSULE | Freq: Three times a day (TID) | ORAL | 0 refills | Status: DC

## 2018-10-27 MED ORDER — NAPROXEN 500 MG PO TABS: 500.0000 mg | ORAL_TABLET | Freq: Two times a day (BID) | ORAL | 0 refills | Status: DC

## 2018-10-27 MED ORDER — IBUPROFEN 800 MG PO TABS
800.0000 mg | ORAL_TABLET | Freq: Once | ORAL | Status: AC
  Administered 2018-10-27: 800 mg via ORAL
  Filled 2018-10-27: qty 1

## 2018-10-27 NOTE — ED Provider Notes (Signed)
[facility] EMERGENCY DEPARTMENT Provider Note   CSN: [id number] Arrival date & time: 10/27/18  0844     History   Chief Complaint Chief Complaint  Patient presents with  . Follow-up    HPI Jessica Russo is a 62 y.o. female with past medical history significant for hypertension, hypercholesterolemia who presents for evaluation of follow-up.  Patient states she sustained a laceration to the left portion of her tongue approximately 1 month ago.  Patient states she was seen in the emergency department for 2 weeks ago for evaluation of this.  Patient states she was given 7 days of amoxicillin which helped with the swelling and pain.  Patient states she still has intermittent pain located to the left portion of her tongue.  States her laceration occurred due to multiple fractured teeth as well as dental caries to the left portion of her lower jaw. Did not receive suture due to presentation >14 days after initial incident. Patient states she was given information for referral to dentist at [facility], however states her appointment has not been set.  Patient states that [facility] told her it could take months to get her in.  Patient denies increase in tongue swelling, fevers, chills, nausea, vomiting, difficulty with p.o. intake, difficulty tolerating secretions, sore throat, phonation changes, shortness of breath, difficulty breathing, jaw pain, trismus.  Has not taken anything for pain.  Rates her pain a 4/10.  Patient states that whenever she chews she feels like she keeps "reopening the wound." Denies additional alleviating or aggravating symptoms other than stated above.  History obtained from patient. No interpretor was used. HPI  Past Medical History:  Diagnosis Date  . Arthritis   . History of stomach ulcers   . Hypercholesteremia   . Hypertension     There are no active problems to display for this patient.   History reviewed. No pertinent surgical history.   OB History    Gravida  2   Para  2   Term  2   Preterm      AB      Living  2     SAB      TAB      Ectopic      Multiple      Live Births               Home Medications    Prior to Admission medications   Medication Sig Start Date End Date Taking? Authorizing Provider  lisinopril-hydrochlorothiazide (PRINZIDE,ZESTORETIC) 10-12.5 MG tablet Take 1 tablet by mouth daily.   Yes [provider]  lovastatin (MEVACOR) 10 MG tablet Take 10 mg by mouth at bedtime.   Yes [provider]  amoxicillin (AMOXIL) 500 MG capsule Take 1 capsule (500 mg total) by mouth 3 (three) times daily. Patient not taking: Reported on 10/27/2018 10/27/18   Suraj Ramdass A, PA-C  naproxen (NAPROSYN) 500 MG tablet Take 1 tablet (500 mg total) by mouth 2 (two) times daily. 10/27/18   Vasiliy Mccarry A, PA-C  traMADol (ULTRAM) 50 MG tablet 1 or 2 po q6h prn pain Patient not taking: Reported on 10/27/2018 10/09/18   Ivery Quale, PA-C    Family History Family History  Problem Relation Age of Onset  . Heart failure Father   . Cancer Mother     Social History Social History   Tobacco Use  . Smoking status: Never Smoker  . Smokeless tobacco: Never Used  Substance Use Topics  . Alcohol use: No  .  Drug use: No     Allergies   Patient has no known allergies.   Review of Systems Review of Systems  Constitutional: Negative.   HENT: Positive for dental problem. Negative for congestion, drooling, ear discharge, ear pain, facial swelling, hearing loss, mouth sores, nosebleeds, postnasal drip, rhinorrhea, sinus pressure, sinus pain, sneezing, sore throat, tinnitus, trouble swallowing and voice change.   Respiratory: Negative for cough, choking, shortness of breath, wheezing and stridor.   Cardiovascular: Negative for chest pain.  Gastrointestinal: Negative for nausea and vomiting.  Musculoskeletal: Negative for neck pain and neck stiffness.  Skin: Negative.   Neurological: Negative for  dizziness, facial asymmetry, weakness, light-headedness, numbness and headaches.  All other systems reviewed and are negative.    Physical Exam Updated Vital Signs BP 129/71 (BP Location: Right Leg)   Pulse 93   Temp 98.5 F (36.9 C) (Oral)   Resp 16   Ht 5' 3.5" (1.613 m)   Wt 68 kg   SpO2 98%   BMI 26.15 kg/m   Physical Exam Vitals signs and nursing note reviewed.  Constitutional:      General: She is not in acute distress.    Appearance: Normal appearance. She is well-developed. She is not ill-appearing, toxic-appearing or diaphoretic.  HENT:     Head: Normocephalic and atraumatic.     Jaw: There is normal jaw occlusion. No trismus, tenderness, swelling, pain on movement or malocclusion.     Nose: Nose normal. No signs of injury, nasal tenderness, mucosal edema, congestion or rhinorrhea.     Right Sinus: No maxillary sinus tenderness or frontal sinus tenderness.     Left Sinus: No maxillary sinus tenderness or frontal sinus tenderness.     Mouth/Throat:     Lips: Pink. No lesions.     Mouth: Mucous membranes are moist. No oral lesions or angioedema.     Dentition: Abnormal dentition. Dental tenderness and dental caries present. No gingival swelling, dental abscesses or gum lesions.     Palate: No mass and lesions. Palate does not elevate in midline.     Pharynx: Oropharynx is clear. Uvula midline. No pharyngeal swelling, oropharyngeal exudate, posterior oropharyngeal erythema or uvula swelling.     Tonsils: No tonsillar exudate or tonsillar abscesses.      Comments: Approximately 5 mm, well-healing laceration to mid left lateral surface of tongue.  No active bleeding or drainage.  There is no erythema, edema.  Mild tenderness palpation over laceration.  Tenderness palpation over teeth #19,20,21.  Overall poor dentition with multiple missing teeth and multiple dental cavities.  No evidence of periapical abscesses.  No gingival erythema or swelling. She does have evidence of  multiple fractured teeth.  Patient states this "occurred years ago."  Tonsils without edema, erythema.  Phonation normal.  No evidence of tongue or oropharyngeal swelling. No drooling or dysphagia. No submandibular swelling or tongue protrusion. Uvula midline without edema. Eyes:     Pupils: Pupils are equal, round, and reactive to light.  Neck:     Musculoskeletal: Full passive range of motion without pain, normal range of motion and neck supple. Normal range of motion. No edema, erythema, neck rigidity, crepitus, injury, pain with movement, spinous process tenderness or muscular tenderness.     Trachea: Phonation normal.  Cardiovascular:     Rate and Rhythm: Normal rate.     Pulses: Normal pulses.     Heart sounds: Normal heart sounds.  Pulmonary:     Effort: Pulmonary effort is normal. No respiratory  distress.     Breath sounds: Normal breath sounds and air entry. No stridor or decreased air movement. No decreased breath sounds, wheezing, rhonchi or rales.  Abdominal:     General: There is no distension.  Musculoskeletal: Normal range of motion.  Lymphadenopathy:     Cervical: No cervical adenopathy.  Skin:    General: Skin is warm and dry.  Neurological:     Mental Status: She is alert.  Psychiatric:        Behavior: Behavior is cooperative.      ED Treatments / Results  Labs (all labs ordered are listed, but only abnormal results are displayed) Labs Reviewed - No data to display  EKG None  Radiology No results found.  Procedures Procedures (including critical care time)  Medications Ordered in ED Medications  ibuprofen (ADVIL,MOTRIN) tablet 800 mg (800 mg Oral Given 10/27/18 0953)     Initial Impression / Assessment and Plan / ED Course  I have reviewed the triage vital signs and the nursing notes.  Pertinent labs & imaging results that were available during my care of the patient were reviewed by me and considered in my medical decision making (see chart for  details).  62 year old female who appears otherwise well presents for evaluation of tongue pain.  Patient sustained a laceration proximately 1 month ago secondary to poor dentition to lower teeth.  Was seen approximately 2 weeks for this issue.  Was prescribed tramadol as well as amoxicillin.  Patient states tongue swelling and pain decreased, however she still has tenderness to the left lateral portion of her tongue.  Patient states due to poor dentition every time she chews she feels like she is reopening her laceration.  No shortness of breath, phonation changes, tongue swelling, oropharyngeal swelling or evidence of cellulitis.  Tongue and posterior oropharynx without evidence of swelling, edema or exudate.  Able to visualize posterior oropharynx without difficulty. Uvula midline without edema. No difficulty with oral secretions.  Denies fevers. She does have overall poor dentition with multiple missing teeth as well as multiple dental caries.  She does have fractured teeth 19, 20, 21.  Patient states these fractures occurred "a long time ago."  No evidence of gingival swelling or edema. No periapical abscess. She does have follow-up appointment with [facility], however states her appointment is not for "a couple months."  Afebrile, nonseptic, non-ill-appearing.  She has been able to tolerate p.o. intake without difficulty.  Approximately 5 mm well-healing laceration to left lateral tongue.  No evidence of active bleeding or drainage.  No evidence of surrounding swelling or erythema.  Overall wound appears to be well healing however with continued pain. Discussed additional antibiotics as well as strict return precautions.  Discussed follow-up with the ENT and dentist for reevaluation.  Hemodynamically stable and appropriate for DC home at this time. Low suspicion for emergent pathology causing patient symptoms at this time. Does not appear in any respiratory distress.  Able to speak in full sentences without  difficulty.  Oxygen saturations 98% on room air with good waveform.  Discussed strict return precautions. Patient voiced understanding and is agreeable for follow-up.    Final Clinical Impressions(s) / ED Diagnoses   Final diagnoses:  Tongue pain    ED Discharge Orders         Ordered    amoxicillin (AMOXIL) 500 MG capsule  3 times daily     10/27/18 1001    naproxen (NAPROSYN) 500 MG tablet  2 times daily  10/27/18 1001           Kadeidra Coryell A, PA-C 10/27/18 1017    Vanetta Mulders, MD 10/31/18 1056

## 2018-10-27 NOTE — Discharge Instructions (Addendum)
He waited today for tongue pain.  Most likely due to the laceration.  I will prescribe you an additional course of amoxicillin.  Please take Tylenol and ibuprofen as needed for pain.  I referred you to an ear nose and throat.  Please follow-up with them for evaluation of your tongue pain.  I would also suggest following up with a dentist as soon as possible for evaluation of your dental issues.  Return to the ED for any worsening symptoms.

## 2018-10-27 NOTE — ED Triage Notes (Signed)
Patient seen here in ED 2 weeks ago and diagnosed with infection to tongue. Patient given 7 day course of amoxicillin, per patient improved some but never went away. Patient states still having swelling to tongue and pain. Denies any fevers or difficulty breathing.

## 2019-12-29 ENCOUNTER — Encounter (HOSPITAL_COMMUNITY): Payer: Self-pay | Admitting: *Deleted

## 2019-12-29 ENCOUNTER — Other Ambulatory Visit: Payer: Self-pay

## 2019-12-29 ENCOUNTER — Emergency Department (HOSPITAL_COMMUNITY)
Admission: EM | Admit: 2019-12-29 | Discharge: 2019-12-29 | Disposition: A | Discharge status: Home / Self Care | Payer: Self-pay | Attending: Emergency Medicine | Admitting: Emergency Medicine

## 2019-12-29 ENCOUNTER — Emergency Department (HOSPITAL_COMMUNITY): Payer: Self-pay

## 2019-12-29 DIAGNOSIS — J069 Acute upper respiratory infection, unspecified: Secondary | ICD-10-CM | POA: Insufficient documentation

## 2019-12-29 DIAGNOSIS — R05 Cough: Secondary | ICD-10-CM

## 2019-12-29 DIAGNOSIS — R059 Cough, unspecified: Secondary | ICD-10-CM

## 2019-12-29 DIAGNOSIS — R072 Precordial pain: Secondary | ICD-10-CM

## 2019-12-29 DIAGNOSIS — R0602 Shortness of breath: Secondary | ICD-10-CM | POA: Insufficient documentation

## 2019-12-29 DIAGNOSIS — I10 Essential (primary) hypertension: Secondary | ICD-10-CM | POA: Insufficient documentation

## 2019-12-29 DIAGNOSIS — R0789 Other chest pain: Secondary | ICD-10-CM | POA: Insufficient documentation

## 2019-12-29 LAB — COMPREHENSIVE METABOLIC PANEL
ALT: 24 U/L (ref 0–44)
AST: 22 U/L (ref 15–41)
Albumin: 3.6 g/dL (ref 3.5–5.0)
Alkaline Phosphatase: 65 U/L (ref 38–126)
Anion gap: 12 (ref 5–15)
BUN: 7 mg/dL — ABNORMAL LOW (ref 8–23)
CO2: 25 mmol/L (ref 22–32)
Calcium: 8.9 mg/dL (ref 8.9–10.3)
Chloride: 102 mmol/L (ref 98–111)
Creatinine, Ser: 0.41 mg/dL — ABNORMAL LOW (ref 0.44–1.00)
GFR calc Af Amer: >60 mL/min (ref >60)
GFR calc non Af Amer: >60 mL/min (ref >60)
Glucose, Bld: 115 mg/dL — ABNORMAL HIGH (ref 70–99)
Potassium: 3.6 mmol/L (ref 3.5–5.1)
Sodium: 139 mmol/L (ref 135–145)
Total Bilirubin: 0.4 mg/dL (ref 0.3–1.2)
Total Protein: 6.9 g/dL (ref 6.5–8.1)

## 2019-12-29 LAB — CBC WITH DIFFERENTIAL/PLATELET
Abs Immature Granulocytes: 0.02 10*3/uL (ref 0.00–0.07)
Basophils Absolute: 0.1 10*3/uL (ref 0.0–0.1)
Basophils Relative: 1 %
Eosinophils Absolute: 0.1 10*3/uL (ref 0.0–0.5)
Eosinophils Relative: 1 %
HCT: 41.4 % (ref 36.0–46.0)
Hemoglobin: 13.2 g/dL (ref 12.0–15.0)
Immature Granulocytes: 0 %
Lymphocytes Relative: 16 %
Lymphs Abs: 1.3 10*3/uL (ref 0.7–4.0)
MCH: 28 pg (ref 26.0–34.0)
MCHC: 31.9 g/dL (ref 30.0–36.0)
MCV: 87.9 fL (ref 80.0–100.0)
Monocytes Absolute: 0.7 10*3/uL (ref 0.1–1.0)
Monocytes Relative: 8 %
Neutro Abs: 6.3 10*3/uL (ref 1.7–7.7)
Neutrophils Relative %: 74 %
Platelets: 343 10*3/uL (ref 150–400)
RBC: 4.71 MIL/uL (ref 3.87–5.11)
RDW: 13.4 % (ref 11.5–15.5)
WBC: 8.5 10*3/uL (ref 4.0–10.5)
nRBC: 0 % (ref 0.0–0.2)

## 2019-12-29 LAB — TROPONIN I (HIGH SENSITIVITY)
Troponin I (High Sensitivity): <2 ng/L (ref <18)
Troponin I (High Sensitivity): <2 ng/L (ref <18)

## 2019-12-29 MED ORDER — ALBUTEROL SULFATE (2.5 MG/3ML) 0.083% IN NEBU: 5.0000 mg | INHALATION_SOLUTION | Freq: Once | RESPIRATORY_TRACT | Status: DC

## 2019-12-29 MED ORDER — FLUTICASONE PROPIONATE 50 MCG/ACT NA SUSP: 2.0000 | Freq: Every day | NASAL | 0 refills | Status: AC

## 2019-12-29 MED ORDER — DOXYCYCLINE HYCLATE 100 MG PO TABS
100.0000 mg | ORAL_TABLET | Freq: Once | ORAL | Status: AC
  Administered 2019-12-29: 15:00:00 100 mg via ORAL
  Filled 2019-12-29: qty 1

## 2019-12-29 MED ORDER — DOXYCYCLINE HYCLATE 100 MG PO CAPS: 100.0000 mg | ORAL_CAPSULE | Freq: Two times a day (BID) | ORAL | 0 refills | Status: DC

## 2019-12-29 NOTE — ED Triage Notes (Signed)
Pt c/o SOB, cough and mid chest pain that started 3-4 days ago, worsening since last night. Pt denies fever, loss of sense of smell and taste. Pt does report feeling "worn out". Pt denies any Covid exposure.

## 2019-12-29 NOTE — Discharge Instructions (Signed)
You were seen today with chest discomfort and chest congestion. We are testing you for COVID. Please remain in quarantine and follow your test results in MyChart. Take the antibiotic and use the nasal spray as directed. Call your PCP on Monday for a follow up appointment. Return to the ED with any new or worsening symptoms.

## 2019-12-29 NOTE — ED Notes (Signed)
Pt reports 3-4 hx of shortness of breath with coughing up "yellow" stuff Came here "thought I'd get an xray and have bronchitir

## 2019-12-29 NOTE — ED Notes (Signed)
Pt declines covid test send out saying she believes she does not need it  She states she has been isolating and feels she has not been exposed to covid

## 2019-12-29 NOTE — ED Notes (Signed)
Second call to lab regarding trop results  Lab had reported that second trop drawn, byt not in lab at 1407  Second call lab reports no blood in lab for trop  Pt is distressed, demands IV removal because it is causing discomfort  Awaiting second trop prior to DC

## 2019-12-29 NOTE — ED Provider Notes (Signed)
Emergency Department Provider Note   I have reviewed the triage vital signs and the nursing notes.   HISTORY  Chief Complaint Shortness of Breath and Chest Pain   HPI Jessica Russo is a 64 y.o. female with PMH of HLD and HTN presents emergency department for evaluation of congestion with central chest tightness and cough.  Symptoms have been ongoing for the past 4 days and gradually worsening.  Patient describes now more constant chest tightness but states her pain is worse with coughing.  She denies specific pleuritic type pain.  Pain is not worse with exertion.  She denies any diaphoresis, nausea/vomiting.  No sick contacts.  No known COVID-19 contacts.  She denies diarrhea or abdominal pain.  No new medications.  She states that last night she had an episode where she felt acutely short of breath.  She called for her husband and stood up and over several minutes felt somewhat improved but states that episode prompted her to come in for evaluation today.  She does not smoke cigarettes or live with a smoker.  She notes a remote history of asthma but has not had to use an inhaler for many years.   Patient also notes an ongoing area of dental pain with some new swelling.  She has had pain in this location for "a while" but does note some swelling along the bottom of her left jaw.  She states in the past antibiotics have improved her symptoms and is requesting this today as well.    Past Medical History:  Diagnosis Date  . Arthritis   . History of stomach ulcers   . Hypercholesteremia   . Hypertension     There are no problems to display for this patient.   History reviewed. No pertinent surgical history.  Allergies Patient has no known allergies.  Family History  Problem Relation Age of Onset  . Heart failure Father   . Cancer Mother     Social History Social History   Tobacco Use  . Smoking status: Never Smoker  . Smokeless tobacco: Never Used  Substance Use Topics    . Alcohol use: Not Currently  . Drug use: Not Currently    Review of Systems  Constitutional: No fever/chills Eyes: No visual changes. ENT: No sore throat. Positive congestion. No loss of smell/taste.  Cardiovascular: Positive chest pain. Respiratory: Positive shortness of breath and cough.  Gastrointestinal: No abdominal pain.  No nausea, no vomiting.  No diarrhea.  No constipation. Genitourinary: Negative for dysuria. Musculoskeletal: Negative for back pain. Skin: Negative for rash. Neurological: Negative for headaches, focal weakness or numbness.  10-point ROS otherwise negative.  ____________________________________________   PHYSICAL EXAM:  VITAL SIGNS: ED Triage Vitals  Enc Vitals Group     BP 12/29/19 1119 126/76     Pulse Rate 12/29/19 1119 90     Resp 12/29/19 1119 (!) 24     Temp 12/29/19 1119 98.3 F (36.8 C)     Temp Source 12/29/19 1119 Oral     SpO2 12/29/19 1119 95 %     Weight 12/29/19 1114 148 lb (67.1 kg)     Height 12/29/19 1114 5' 3.5" (1.613 m)   Constitutional: Alert and oriented. Well appearing and in no acute distress. Eyes: Conjunctivae are normal. Head: Atraumatic. Nose: Mild congestion/rhinnorhea. Mouth/Throat: Mucous membranes are moist.  Oropharynx non-erythematous.  No peritonsillar abscess.  Oropharynx is widely patent and patient is managing her oral secretions.  Mild swelling at the base of  her left gumline near her mandibular molars.  No clear abscess clinically.  No trismus.  Soft submandibular compartment. Neck: No stridor.  Cardiovascular: Normal rate, regular rhythm. Good peripheral circulation. Grossly normal heart sounds.   Respiratory: Normal respiratory effort.  No retractions. Lungs CTAB. Gastrointestinal: Soft and nontender. No distention.  Musculoskeletal: No gross deformities of extremities. Neurologic:  Normal speech and language. Skin:  Skin is warm, dry and intact. No rash  noted.   ____________________________________________   LABS (all labs ordered are listed, but only abnormal results are displayed)  Labs Reviewed  COMPREHENSIVE METABOLIC PANEL - Abnormal; Notable for the following components:      Result Value   Glucose, Bld 115 (*)    BUN 7 (*)    Creatinine, Ser 0.41 (*)    All other components within normal limits  NOVEL CORONAVIRUS, NAA (HOSP ORDER, SEND-OUT TO REF LAB; TAT 18-24 HRS)  CBC WITH DIFFERENTIAL/PLATELET  POC SARS CORONAVIRUS 2 AG -  ED  TROPONIN I (HIGH SENSITIVITY)  TROPONIN I (HIGH SENSITIVITY)   ____________________________________________  EKG   EKG Interpretation  Date/Time:  Sunday December 29 2019 11:19:51 EST Ventricular Rate:  92 PR Interval:    QRS Duration: 76 QT Interval:  338 QTC Calculation: 419 R Axis:   63 Text Interpretation: Sinus rhythm Probable left atrial enlargement Probable anteroseptal infarct, old Similar to 2018 tracing. No STEMI Confirmed by Alona Bene [phone]) on 12/29/2019 11:31:55 AM       ____________________________________________  RADIOLOGY  CXR reviewed with no acute findings.   ____________________________________________   PROCEDURES  Procedure(s) performed:   Procedures  None  ____________________________________________   INITIAL IMPRESSION / ASSESSMENT AND PLAN / ED COURSE  Pertinent labs & imaging results that were available during my care of the patient were reviewed by me and considered in my medical decision making (see chart for details).   Patient presents emergency department for evaluation of bronchitis type symptoms over the past 4 days.  Plan for Covid testing.  Patient is also developed some central chest tightness which seems more related to bronchitis/infectious etiology but will obtain troponin for evaluation given the patient's age and risk factors of hypertension and high cholesterol.  EKG with no ischemic changes as interpreted above.   Labs and  imaging reviewed. Second troponin negative. Plan to treat for bronchitis but will send COVID PCR. Patient to quarantine at home and follow MyChart app for results. Discussed ED return precautions and plan at discharge.  ____________________________________________  FINAL CLINICAL IMPRESSION(S) / ED DIAGNOSES  Final diagnoses:  Precordial chest pain  Upper respiratory tract infection, unspecified type     MEDICATIONS GIVEN DURING THIS VISIT:  Medications  doxycycline (VIBRA-TABS) tablet 100 mg (100 mg Oral Given 12/29/19 1511)     NEW OUTPATIENT MEDICATIONS STARTED DURING THIS VISIT:  Discharge Medication List as of 12/29/2019  3:06 PM    START taking these medications   Details  doxycycline (VIBRAMYCIN) 100 MG capsule Take 1 capsule (100 mg total) by mouth 2 (two) times daily., Starting Sun 12/29/2019, Normal    fluticasone (FLONASE) 50 MCG/ACT nasal spray Place 2 sprays into both nostrils daily for 7 days., Starting Sun 12/29/2019, Until Sun 01/05/2020, Normal        Note:  This document was prepared using Dragon voice recognition software and may include unintentional dictation errors.  Alona Bene, MD, [facility]    Azreal Stthomas, Arlyss Repress, MD 12/30/19 [phone]

## 2020-01-08 ENCOUNTER — Encounter (HOSPITAL_COMMUNITY): Payer: Self-pay | Admitting: Emergency Medicine

## 2020-01-08 ENCOUNTER — Emergency Department (HOSPITAL_COMMUNITY): Payer: Self-pay

## 2020-01-08 ENCOUNTER — Other Ambulatory Visit: Payer: Self-pay

## 2020-01-08 ENCOUNTER — Emergency Department (HOSPITAL_COMMUNITY)
Admission: EM | Admit: 2020-01-08 | Discharge: 2020-01-08 | Disposition: A | Discharge status: Home / Self Care | Payer: Self-pay | Attending: Emergency Medicine | Admitting: Emergency Medicine

## 2020-01-08 DIAGNOSIS — R070 Pain in throat: Secondary | ICD-10-CM | POA: Insufficient documentation

## 2020-01-08 DIAGNOSIS — R0789 Other chest pain: Secondary | ICD-10-CM | POA: Insufficient documentation

## 2020-01-08 DIAGNOSIS — R059 Cough, unspecified: Secondary | ICD-10-CM

## 2020-01-08 DIAGNOSIS — I10 Essential (primary) hypertension: Secondary | ICD-10-CM | POA: Insufficient documentation

## 2020-01-08 DIAGNOSIS — Z79899 Other long term (current) drug therapy: Secondary | ICD-10-CM | POA: Insufficient documentation

## 2020-01-08 DIAGNOSIS — R05 Cough: Secondary | ICD-10-CM | POA: Insufficient documentation

## 2020-01-08 MED ORDER — BENZONATATE 100 MG PO CAPS
200.0000 mg | ORAL_CAPSULE | Freq: Once | ORAL | Status: AC
  Administered 2020-01-08: 10:00:00 200 mg via ORAL
  Filled 2020-01-08: qty 2

## 2020-01-08 MED ORDER — AZITHROMYCIN 500 MG PO TABS: 500.0000 mg | ORAL_TABLET | Freq: Every day | ORAL | 0 refills | Status: AC

## 2020-01-08 MED ORDER — BENZONATATE 100 MG PO CAPS: 200.0000 mg | ORAL_CAPSULE | Freq: Three times a day (TID) | ORAL | 0 refills | Status: AC | PRN

## 2020-01-08 MED ORDER — GUAIFENESIN-CODEINE 100-10 MG/5ML PO SOLN
10.0000 mL | Freq: Once | ORAL | Status: DC
  Filled 2020-01-08: qty 10

## 2020-01-08 NOTE — ED Provider Notes (Signed)
[facility] EMERGENCY DEPARTMENT Provider Note   CSN: [id number] Arrival date & time: 01/08/20  3299     History Chief Complaint  Patient presents with  . Cough    Jessica Russo is a 64 y.o. female with significant PMH of asthma and HTN, who presents to the emergency room for follow-up of her continued cough. Pt was seen in the department 10 days ago for cough/chest congestion and was treated for bronchitis with doxycycline and flonase. Since that time, pt states her congestion and cough have remained unchanged/gradually worsened. Completed 8 days of doxycycline treatment. Also taking Flonase, Claritin, Robitussin, and Mucinex at home, which have not improved her symptoms. Denies fevers, chills, shortness of breath, nausea/vomiting, or abdominal pain/diarrhea. No difficulty swallowing, pt is able to tolerate PO.  The history is provided by the patient.  Cough Cough characteristics:  Non-productive Severity:  Moderate Onset quality:  Gradual Duration:  14 days Timing:  Constant Progression:  Worsening Chronicity:  New Smoker: no   Context: upper respiratory infection   Context: not sick contacts and not smoke exposure   Relieved by:  Nothing Worsened by:  Nothing Ineffective treatments:  Cough suppressants and decongestant Associated symptoms: chest pain (pleurtic and cough induced), rhinorrhea, sinus congestion and sore throat   Associated symptoms: no chills, no diaphoresis, no ear pain, no eye discharge, no fever, no headaches, no myalgias, no rash, no shortness of breath and no wheezing   Risk factors: recent infection       Past Medical History:  Diagnosis Date  . Arthritis   . History of stomach ulcers   . Hypercholesteremia   . Hypertension     There are no problems to display for this patient.   History reviewed. No pertinent surgical history.   OB History    Gravida  2   Para  2   Term  2   Preterm      AB      Living  2     SAB      TAB        Ectopic      Multiple      Live Births              Family History  Problem Relation Age of Onset  . Heart failure Father   . Cancer Mother     Social History   Tobacco Use  . Smoking status: Never Smoker  . Smokeless tobacco: Never Used  Substance Use Topics  . Alcohol use: Not Currently  . Drug use: Not Currently    Home Medications Prior to Admission medications   Medication Sig Start Date End Date Taking? Authorizing Provider  amoxicillin (AMOXIL) 500 MG capsule Take 1 capsule (500 mg total) by mouth 3 (three) times daily. Patient not taking: Reported on 10/27/2018 10/27/18   Henderly, Britni A, PA-C  azithromycin (ZITHROMAX) 500 MG tablet Take 1 tablet (500 mg total) by mouth daily for 3 days. 01/08/20 01/11/20  Thom Chimes, MD  benzonatate (TESSALON PERLES) 100 MG capsule Take 2 capsules (200 mg total) by mouth 3 (three) times daily as needed for cough. 01/08/20   Thom Chimes, MD  doxycycline (VIBRAMYCIN) 100 MG capsule Take 1 capsule (100 mg total) by mouth 2 (two) times daily. 12/29/19   Long, Arlyss Repress, MD  fluticasone (FLONASE) 50 MCG/ACT nasal spray Place 2 sprays into both nostrils daily for 7 days. 12/29/19 01/05/20  Long, Arlyss Repress, MD  lisinopril-hydrochlorothiazide (PRINZIDE,ZESTORETIC) 10-12.5 MG  tablet Take 1 tablet by mouth daily.    [provider]  lovastatin (MEVACOR) 10 MG tablet Take 10 mg by mouth at bedtime.    [provider]  naproxen (NAPROSYN) 500 MG tablet Take 1 tablet (500 mg total) by mouth 2 (two) times daily. Patient not taking: Reported on 12/29/2019 10/27/18   Henderly, Britni A, PA-C  traMADol (ULTRAM) 50 MG tablet 1 or 2 po q6h prn pain Patient not taking: Reported on 10/27/2018 10/09/18   Ivery Quale, PA-C    Allergies    Patient has no known allergies.  Review of Systems   Review of Systems  Constitutional: Negative for chills, diaphoresis and fever.  HENT: Positive for rhinorrhea and sore throat. Negative  for ear pain.   Eyes: Negative for pain and discharge.  Respiratory: Positive for cough. Negative for shortness of breath and wheezing.   Cardiovascular: Positive for chest pain (pleurtic and cough induced).  Gastrointestinal: Negative for diarrhea, nausea and vomiting.  Genitourinary: Negative for dysuria and frequency.  Musculoskeletal: Negative for back pain and myalgias.  Skin: Negative for rash.  Neurological: Negative for dizziness, light-headedness and headaches.    Physical Exam Updated Vital Signs BP 116/74 (BP Location: Right Arm)   Pulse 89   Temp 98.4 F (36.9 C) (Oral)   Resp 20   Ht 5' 3.5" (1.613 m)   Wt 67 kg   SpO2 98%   BMI 25.75 kg/m   Physical Exam Vitals and nursing note reviewed.  Constitutional:      General: She is not in acute distress.    Appearance: She is not ill-appearing.     Comments: Pt with normal appearance resting comfortably in bed. Occasional non-productive cough throughout examination.  HENT:     Head: Normocephalic and atraumatic.     Right Ear: Tympanic membrane normal.     Left Ear: Tympanic membrane normal.     Nose: Nose normal.     Mouth/Throat:     Mouth: Mucous membranes are moist.     Pharynx: Uvula midline. Posterior oropharyngeal erythema present. No oropharyngeal exudate.  Eyes:     Conjunctiva/sclera: Conjunctivae normal.  Cardiovascular:     Rate and Rhythm: Normal rate and regular rhythm.     Heart sounds: Normal heart sounds.  Pulmonary:     Effort: Pulmonary effort is normal. No respiratory distress.     Breath sounds: Normal breath sounds. No wheezing, rhonchi or rales.  Abdominal:     General: Abdomen is flat. Bowel sounds are normal.     Palpations: Abdomen is soft.     Tenderness: There is no abdominal tenderness.  Musculoskeletal:        General: Normal range of motion.  Lymphadenopathy:     Cervical: No cervical adenopathy.  Skin:    General: Skin is warm and dry.  Neurological:     Mental Status:  She is alert and oriented to person, place, and time.  Psychiatric:        Mood and Affect: Mood normal.    ED Results / Procedures / Treatments   Labs (all labs ordered are listed, but only abnormal results are displayed) Labs Reviewed - No data to display  EKG None  Radiology DG Chest 2 View  Result Date: 01/08/2020 CLINICAL DATA:  Persistent cough and progressive chest congestion. EXAM: CHEST - 2 VIEW COMPARISON:  Chest x-rays dated 12/29/2019 and 12/30/2015 FINDINGS: The heart size and pulmonary vascularity are normal and the lungs are clear. No  effusions. No acute bone abnormality. Pectus carinatum deformity. No acute bone abnormality. IMPRESSION: No acute disease. Electronically Signed   By: Lorriane Shire M.D.   On: 01/08/2020 10:15    Procedures Procedures (including critical care time)  Medications Ordered in ED Medications  guaiFENesin-codeine 100-10 MG/5ML solution 10 mL (10 mLs Oral Not Given 01/08/20 0932)  benzonatate (TESSALON) capsule 200 mg (200 mg Oral Given 01/08/20 7253)    ED Course  I have reviewed the triage vital signs and the nursing notes.  Pertinent labs & imaging results that were available during my care of the patient were reviewed by me and considered in my medical decision making (see chart for details).    MDM Rules/Calculators/A&P                      Ms. Kaatz is a 64 y.o. female with significant PMH of asthma and HTN, who presents for follow-up of a 14 day history of cough and chest congestion. She is well appearing, afebrile, and not hypoxic. On exam, normal respiratory effort, good air movement, and clear to ausculation in all fields. Repeat CXR without evidence of new pneumonia or effusion. Pt refused repeat COVID testing with PCR, stating she had no known exposures/sick contacts. Previous rapid antigen test on 2/14 was negative.  Pt states her cough improved with tessalon capsules in the ED. Requesting a course of azithromycin as this has  worked for her bronchitis episodes in the past. Suspect pt's cough is reactive in nature following recent upper respiratory tract infection. Will discharge home with 3 day azithromycin course and tessalon capsules.   Final Clinical Impression(s) / ED Diagnoses Final diagnoses:  Cough    Rx / DC Orders ED Discharge Orders         Ordered    benzonatate (TESSALON PERLES) 100 MG capsule  3 times daily PRN     01/08/20 1037    azithromycin (ZITHROMAX) 500 MG tablet  Daily     01/08/20 1037           Ladona Horns, MD 01/08/20 1040    Lajean Saver, MD 01/08/20 1140

## 2020-01-08 NOTE — ED Triage Notes (Signed)
Pt reports seen for same on 12/29/19. Pt reports continued cough and worsening congestion, intermittent hoarseness. Pt reports was covid negative at that time. Pt reports has been taking abx for eight days.

## 2020-01-08 NOTE — Discharge Instructions (Signed)
Ms. Jessica Russo, Jessica Russo were seen in the emergency room for continued cough/chest congestion. Your chest x-ray did not show any signs of infection, pneumonia, or fluid in the lungs. Your cough is likely residual from your upper respiratory tract infection. Please take a three day course of azithromycin and tessalon as needed for your cough.

## 2020-08-13 ENCOUNTER — Emergency Department (HOSPITAL_COMMUNITY)
Admission: EM | Admit: 2020-08-13 | Discharge: 2020-08-13 | Disposition: A | Discharge status: Home / Self Care | Payer: No Typology Code available for payment source | Attending: Emergency Medicine | Admitting: Emergency Medicine

## 2020-08-13 ENCOUNTER — Other Ambulatory Visit: Payer: Self-pay

## 2020-08-13 ENCOUNTER — Emergency Department (HOSPITAL_COMMUNITY): Payer: No Typology Code available for payment source

## 2020-08-13 ENCOUNTER — Encounter (HOSPITAL_COMMUNITY): Payer: Self-pay

## 2020-08-13 DIAGNOSIS — Y939 Activity, unspecified: Secondary | ICD-10-CM | POA: Diagnosis not present

## 2020-08-13 DIAGNOSIS — Y32XXXA Crashing of motor vehicle, undetermined intent, initial encounter: Secondary | ICD-10-CM | POA: Diagnosis not present

## 2020-08-13 DIAGNOSIS — I10 Essential (primary) hypertension: Secondary | ICD-10-CM | POA: Insufficient documentation

## 2020-08-13 DIAGNOSIS — Y999 Unspecified external cause status: Secondary | ICD-10-CM | POA: Diagnosis not present

## 2020-08-13 DIAGNOSIS — Y929 Unspecified place or not applicable: Secondary | ICD-10-CM | POA: Diagnosis not present

## 2020-08-13 DIAGNOSIS — M7912 Myalgia of auxiliary muscles, head and neck: Secondary | ICD-10-CM | POA: Insufficient documentation

## 2020-08-13 DIAGNOSIS — Z79899 Other long term (current) drug therapy: Secondary | ICD-10-CM | POA: Diagnosis not present

## 2020-08-13 DIAGNOSIS — R55 Syncope and collapse: Secondary | ICD-10-CM | POA: Diagnosis not present

## 2020-08-13 DIAGNOSIS — H538 Other visual disturbances: Secondary | ICD-10-CM | POA: Diagnosis not present

## 2020-08-13 DIAGNOSIS — M791 Myalgia, unspecified site: Secondary | ICD-10-CM

## 2020-08-13 MED ORDER — METHOCARBAMOL 500 MG PO TABS: 500.0000 mg | ORAL_TABLET | Freq: Four times a day (QID) | ORAL | 0 refills | Status: AC

## 2020-08-13 MED ORDER — DICLOFENAC SODIUM 75 MG PO TBEC: 75.0000 mg | DELAYED_RELEASE_TABLET | Freq: Two times a day (BID) | ORAL | 0 refills | Status: DC
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
## 2021-03-09 ENCOUNTER — Other Ambulatory Visit (HOSPITAL_COMMUNITY): Payer: Self-pay | Admitting: Neurological Surgery

## 2021-03-09 ENCOUNTER — Other Ambulatory Visit: Payer: Self-pay | Admitting: Neurological Surgery

## 2021-03-09 DIAGNOSIS — M5412 Radiculopathy, cervical region: Secondary | ICD-10-CM

## 2021-03-23 ENCOUNTER — Ambulatory Visit (HOSPITAL_COMMUNITY)
Admission: RE | Admit: 2021-03-23 | Discharge: 2021-03-23 | Disposition: A | Discharge status: Home / Self Care | Payer: Medicare Other | Source: Ambulatory Visit | Attending: Neurological Surgery | Admitting: Neurological Surgery

## 2021-03-23 DIAGNOSIS — M5412 Radiculopathy, cervical region: Secondary | ICD-10-CM | POA: Diagnosis present
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
## 2021-07-13 ENCOUNTER — Encounter (HOSPITAL_COMMUNITY): Payer: Self-pay

## 2021-07-13 ENCOUNTER — Encounter (HOSPITAL_COMMUNITY)
Admission: RE | Admit: 2021-07-13 | Discharge: 2021-07-13 | Disposition: A | Discharge status: Home / Self Care | Payer: Medicare Other | Source: Ambulatory Visit | Attending: Ophthalmology | Admitting: Ophthalmology

## 2021-07-13 ENCOUNTER — Other Ambulatory Visit: Payer: Self-pay

## 2021-07-16 ENCOUNTER — Ambulatory Visit (HOSPITAL_COMMUNITY)
Admission: RE | Admit: 2021-07-16 | Discharge: 2021-07-16 | Disposition: A | Discharge status: Home / Self Care | Payer: Medicare Other | Attending: Ophthalmology | Admitting: Ophthalmology

## 2021-07-16 ENCOUNTER — Encounter (HOSPITAL_COMMUNITY): Payer: Self-pay | Admitting: Ophthalmology

## 2021-07-16 ENCOUNTER — Ambulatory Visit (HOSPITAL_COMMUNITY): Payer: Medicare Other | Admitting: Anesthesiology

## 2021-07-16 ENCOUNTER — Encounter (HOSPITAL_COMMUNITY)
Admission: RE | Disposition: A | Discharge status: Home / Self Care | Payer: Self-pay | Source: Home / Self Care | Attending: Ophthalmology

## 2021-07-16 ENCOUNTER — Other Ambulatory Visit: Payer: Self-pay

## 2021-07-16 DIAGNOSIS — H11153 Pinguecula, bilateral: Secondary | ICD-10-CM | POA: Insufficient documentation

## 2021-07-16 DIAGNOSIS — H25811 Combined forms of age-related cataract, right eye: Secondary | ICD-10-CM | POA: Diagnosis present

## 2021-07-16 DIAGNOSIS — H25813 Combined forms of age-related cataract, bilateral: Secondary | ICD-10-CM | POA: Insufficient documentation

## 2021-07-16 DIAGNOSIS — H0100A Unspecified blepharitis right eye, upper and lower eyelids: Secondary | ICD-10-CM | POA: Diagnosis not present

## 2021-07-16 DIAGNOSIS — H353130 Nonexudative age-related macular degeneration, bilateral, stage unspecified: Secondary | ICD-10-CM | POA: Insufficient documentation

## 2021-07-16 DIAGNOSIS — H0100B Unspecified blepharitis left eye, upper and lower eyelids: Secondary | ICD-10-CM | POA: Insufficient documentation

## 2021-07-16 HISTORY — PX: CATARACT EXTRACTION W/PHACO: SHX586

## 2021-07-16 SURGERY — PHACOEMULSIFICATION, CATARACT, WITH IOL INSERTION
Anesthesia: Monitor Anesthesia Care | Site: Eye | Laterality: Right

## 2021-07-16 MED ORDER — POVIDONE-IODINE 5 % OP SOLN
OPHTHALMIC | Status: DC | PRN
  Administered 2021-07-16: 1 via OPHTHALMIC

## 2021-07-16 MED ORDER — TETRACAINE HCL 0.5 % OP SOLN
1.0000 [drp] | OPHTHALMIC | Status: AC | PRN
  Administered 2021-07-16 (×3): 1 [drp] via OPHTHALMIC

## 2021-07-16 MED ORDER — SODIUM HYALURONATE 23MG/ML IO SOSY
PREFILLED_SYRINGE | INTRAOCULAR | Status: DC | PRN
  Administered 2021-07-16: 0.6 mL via INTRAOCULAR

## 2021-07-16 MED ORDER — FENTANYL CITRATE (PF) 100 MCG/2ML IJ SOLN
INTRAMUSCULAR | Status: DC | PRN
  Administered 2021-07-16: 50 ug via INTRAVENOUS

## 2021-07-16 MED ORDER — LIDOCAINE HCL (PF) 1 % IJ SOLN
INTRAOCULAR | Status: DC | PRN
  Administered 2021-07-16: 1 mL via OPHTHALMIC

## 2021-07-16 MED ORDER — TROPICAMIDE 1 % OP SOLN
1.0000 [drp] | OPHTHALMIC | Status: AC
  Administered 2021-07-16 (×3): 1 [drp] via OPHTHALMIC

## 2021-07-16 MED ORDER — BSS IO SOLN
INTRAOCULAR | Status: DC | PRN
  Administered 2021-07-16: 15 mL via INTRAOCULAR

## 2021-07-16 MED ORDER — FENTANYL CITRATE (PF) 100 MCG/2ML IJ SOLN
INTRAMUSCULAR | Status: AC
  Filled 2021-07-16: qty 2

## 2021-07-16 MED ORDER — EPINEPHRINE PF 1 MG/ML IJ SOLN
INTRAOCULAR | Status: DC | PRN
  Administered 2021-07-16: 500 mL

## 2021-07-16 MED ORDER — EPINEPHRINE PF 1 MG/ML IJ SOLN
INTRAMUSCULAR | Status: AC
  Filled 2021-07-16: qty 2

## 2021-07-16 MED ORDER — STERILE WATER FOR IRRIGATION IR SOLN
Status: DC | PRN
  Administered 2021-07-16: 250 mL

## 2021-07-16 MED ORDER — LIDOCAINE HCL 3.5 % OP GEL
1.0000 "application " | Freq: Once | OPHTHALMIC | Status: AC
  Administered 2021-07-16: 1 via OPHTHALMIC

## 2021-07-16 MED ORDER — SODIUM HYALURONATE 10 MG/ML IO SOLUTION
PREFILLED_SYRINGE | INTRAOCULAR | Status: DC | PRN
  Administered 2021-07-16: 0.85 mL via INTRAOCULAR

## 2021-07-16 MED ORDER — NEOMYCIN-POLYMYXIN-DEXAMETH 3.5-10000-0.1 OP SUSP
OPHTHALMIC | Status: DC | PRN
  Administered 2021-07-16: 1 [drp] via OPHTHALMIC

## 2021-07-16 MED ORDER — PHENYLEPHRINE HCL 2.5 % OP SOLN
1.0000 [drp] | OPHTHALMIC | Status: AC | PRN
  Administered 2021-07-16 (×3): 1 [drp] via OPHTHALMIC

## 2021-07-16 SURGICAL SUPPLY — 11 items
CLOTH BEACON ORANGE TIMEOUT ST (SAFETY) ×2 IMPLANT
EYE SHIELD UNIVERSAL CLEAR (GAUZE/BANDAGES/DRESSINGS) ×2 IMPLANT
GLOVE SURG UNDER POLY LF SZ6.5 (GLOVE) ×2 IMPLANT
GLOVE SURG UNDER POLY LF SZ7 (GLOVE) ×2 IMPLANT
NEEDLE HYPO 18GX1.5 BLUNT FILL (NEEDLE) ×2 IMPLANT
PAD ARMBOARD 7.5X6 YLW CONV (MISCELLANEOUS) ×2 IMPLANT
RayOne EMV US (Intraocular Lens) ×2 IMPLANT
SYR TB 1ML LL NO SAFETY (SYRINGE) ×2 IMPLANT
TAPE SURG TRANSPORE 1 IN (GAUZE/BANDAGES/DRESSINGS) ×1 IMPLANT
TAPE SURGICAL TRANSPORE 1 IN (GAUZE/BANDAGES/DRESSINGS) ×1
WATER STERILE IRR 250ML POUR (IV SOLUTION) ×2 IMPLANT

## 2021-07-16 NOTE — Interval H&P Note (Signed)
History and Physical Interval Note:  07/16/2021 9:03 AM  Jessica Russo  has presented today for surgery, with the diagnosis of Nuclear sclerotic cataract - Right eye.  The various methods of treatment have been discussed with the patient and family. After consideration of risks, benefits and other options for treatment, the patient has consented to  Procedure(s) with comments: CATARACT EXTRACTION PHACO AND INTRAOCULAR LENS PLACEMENT (IOC) (Right) - right as a surgical intervention.  The patient's history has been reviewed, patient examined, no change in status, stable for surgery.  I have reviewed the patient's chart and labs.  Questions were answered to the patient's satisfaction.     Fabio Pierce

## 2021-07-16 NOTE — Anesthesia Preprocedure Evaluation (Signed)
Anesthesia Evaluation  Patient identified by MRN, date of birth, ID band Patient awake    Reviewed: Allergy & Precautions, NPO status , Patient's Chart, lab work & pertinent test results  Airway Mallampati: I  TM Distance: >3 FB Neck ROM: Full   Comment: Neck pain, back pain Dental  (+) Dental Advisory Given, Teeth Intact   Pulmonary neg pulmonary ROS,    Pulmonary exam normal breath sounds clear to auscultation       Cardiovascular Exercise Tolerance: Good hypertension, Pt. on medications Normal cardiovascular exam Rhythm:Regular Rate:Normal     Neuro/Psych negative neurological ROS  negative psych ROS   GI/Hepatic negative GI ROS, Neg liver ROS,   Endo/Other  negative endocrine ROS  Renal/GU negative Renal ROS     Musculoskeletal  (+) Arthritis ,   Abdominal   Peds  Hematology negative hematology ROS (+)   Anesthesia Other Findings Neck pain, back pain  Reproductive/Obstetrics                            Anesthesia Physical Anesthesia Plan  ASA: 2  Anesthesia Plan: MAC   Post-op Pain Management:    Induction:   PONV Risk Score and Plan:   Airway Management Planned: Nasal Cannula and Natural Airway  Additional Equipment:   Intra-op Plan:   Post-operative Plan:   Informed Consent: I have reviewed the patients History and Physical, chart, labs and discussed the procedure including the risks, benefits and alternatives for the proposed anesthesia with the patient or authorized representative who has indicated his/her understanding and acceptance.     Dental advisory given  Plan Discussed with: CRNA and Surgeon  Anesthesia Plan Comments:         Anesthesia Quick Evaluation

## 2021-07-16 NOTE — Transfer of Care (Signed)
Immediate Anesthesia Transfer of Care Note  Patient: Jessica Russo  Procedure(s) Performed: CATARACT EXTRACTION PHACO AND INTRAOCULAR LENS PLACEMENT (IOC) (Right: Eye)  Patient Location: PACU  Anesthesia Type:MAC  Level of Consciousness: awake, alert  and oriented  Airway & Oxygen Therapy: Patient Spontanous Breathing  Post-op Assessment: Report given to RN and Post -op Vital signs reviewed and stable  Post vital signs: Reviewed and stable  Last Vitals:  Vitals Value Taken Time  BP    Temp    Pulse    Resp    SpO2      Last Pain:  Vitals:   07/16/21 0827  TempSrc: Oral  PainSc: 6          Complications: No notable events documented.

## 2021-07-16 NOTE — Anesthesia Postprocedure Evaluation (Signed)
Anesthesia Post Note  Patient: Jessica Russo  Procedure(s) Performed: CATARACT EXTRACTION PHACO AND INTRAOCULAR LENS PLACEMENT (IOC) (Right: Eye)  Patient location during evaluation: Phase II Anesthesia Type: MAC Level of consciousness: awake and alert and oriented Pain management: pain level controlled Vital Signs Assessment: post-procedure vital signs reviewed and stable Respiratory status: spontaneous breathing and respiratory function stable Cardiovascular status: blood pressure returned to baseline and stable Postop Assessment: no apparent nausea or vomiting Anesthetic complications: no   No notable events documented.   Last Vitals:  Vitals:   07/16/21 0827 07/16/21 0930  BP: 116/66 133/89  Pulse: 68 70  Resp: 17 18  Temp: 36.9 C 36.5 C  SpO2: 100% 100%    Last Pain:  Vitals:   07/16/21 0930  TempSrc: Oral  PainSc: 4                  Tempestt Silba C Kennedy Bohanon

## 2021-07-16 NOTE — Op Note (Signed)
Date of procedure: 07/16/21  Pre-operative diagnosis:  Visually significant combined form age-related cataract, Right Eye (H25.811)  Post-operative diagnosis:  Visually significant combined form age-related cataract, Right Eye (H25.811)  Procedure: Removal of cataract via phacoemulsification and insertion of intra-ocular lens Rayner [id number] +24.5D into the capsular bag of the Right Eye  Attending surgeon: Gerda Diss. Scarleth Brame, MD, MA  Anesthesia: MAC, Topical Akten  Complications: None  Estimated Blood Loss: <7m (minimal)  Specimens: None  Implants: As above  Indications:  Visually significant age-related cataract, Right Eye  Procedure:  The patient was seen and identified in the pre-operative area. The operative eye was identified and dilated.  The operative eye was marked.  Topical anesthesia was administered to the operative eye.     The patient was then to the operative suite and placed in the supine position.  A timeout was performed confirming the patient, procedure to be performed, and all other relevant information.   The patient's face was prepped and draped in the usual fashion for intra-ocular surgery.  A lid speculum was placed into the operative eye and the surgical microscope moved into place and focused.  A superotemporal paracentesis was created using a 20 gauge paracentesis blade.  Shugarcaine was injected into the anterior chamber.  Viscoelastic was injected into the anterior chamber.  A temporal clear-corneal main wound incision was created using a 2.428mmicrokeratome.  A continuous curvilinear capsulorrhexis was initiated using an irrigating cystitome and completed using capsulorrhexis forceps.  Hydrodissection and hydrodeliniation were performed.  Viscoelastic was injected into the anterior chamber.  A phacoemulsification handpiece and a chopper as a second instrument were used to remove the nucleus and epinucleus. The irrigation/aspiration handpiece was used to remove any  remaining cortical material.   The capsular bag was reinflated with viscoelastic, checked, and found to be intact.  The intraocular lens was inserted into the capsular bag.  The irrigation/aspiration handpiece was used to remove any remaining viscoelastic.  The clear corneal wound and paracentesis wounds were then hydrated and checked with Weck-Cels to be watertight.  The lid-speculum was removed.  The drape was removed.  The patient's face was cleaned with a wet and dry 4x4.   Maxitrol was instilled in the eye. A clear shield was taped over the eye. The patient was taken to the post-operative care unit in good condition, having tolerated the procedure well.  Post-Op Instructions: [facility] and will receive all other orders and instructions.

## 2021-07-16 NOTE — Discharge Instructions (Signed)
Please discharge patient when stable, will follow up today with Dr. Linford Quintela at the [facility] [place] office immediately following discharge.  Leave shield in place until visit.  All paperwork with discharge instructions will be given at the office.  [facility] Address:  [address]  [place], [place] [address]  

## 2021-07-20 ENCOUNTER — Encounter (HOSPITAL_COMMUNITY): Payer: Self-pay | Admitting: Ophthalmology

## 2021-07-22 ENCOUNTER — Encounter (HOSPITAL_COMMUNITY)
Admission: RE | Admit: 2021-07-22 | Discharge: 2021-07-22 | Disposition: A | Discharge status: Home / Self Care | Payer: Medicare Other | Source: Ambulatory Visit | Attending: Ophthalmology | Admitting: Ophthalmology

## 2021-07-22 ENCOUNTER — Other Ambulatory Visit: Payer: Self-pay

## 2021-08-13 ENCOUNTER — Other Ambulatory Visit (HOSPITAL_COMMUNITY): Payer: Medicare Other

## 2021-08-17 ENCOUNTER — Encounter (HOSPITAL_COMMUNITY): Payer: Self-pay

## 2021-08-17 ENCOUNTER — Encounter (HOSPITAL_COMMUNITY)
Admission: RE | Admit: 2021-08-17 | Discharge: 2021-08-17 | Disposition: A | Discharge status: Home / Self Care | Payer: Medicare Other | Source: Ambulatory Visit | Attending: Ophthalmology | Admitting: Ophthalmology

## 2021-08-17 ENCOUNTER — Other Ambulatory Visit: Payer: Self-pay

## 2021-08-17 NOTE — H&P (Addendum)
Surgical History & Physical  Patient Name: Jessica Russo DOB: 01/22/1956  Surgery: Cataract extraction with intraocular lens implant phacoemulsification; Left Eye  Surgeon: Fabio Pierce MD Surgery Date:  09-27-21 Pre-Op Date:  09-23-21  HPI: A 2 Yr. old female patient PO-OD/Pre-Op OS The patient is returning after cataract surgery. The right eye is affected. Status post cataract surgery, which began 1 week ago: Since the last visit, the affected area is doing well but states she still has some bruising and swelling RLL. Pt is concerned. The patient's vision is improved and stable. Patient is following medication instructions. Pt using purple top drop TID OD. Pt denies any increase in floaters/flashes of light. The patient complains of difficulty when driving, which began 1 year ago. The left eye is affected. The episode is gradual. The condition's severity increased since last visit. Symptoms occur when the patient is driving, inside and outside. This is negatively affecting the patient's quality of life. HPI Completed by Dr. Fabio Pierce  Medical History: Demyelinating Optic Neuropathy, Hypertensive Optic Neuropathy, Ocular ischemia Syndrome Hyperopia, Astigmatism, Presbyopia Elevated Cholesterol, High Blood Pressure  Review of Systems Negative Allergic/Immunologic Negative Cardiovascular Negative Constitutional Negative Ear, Nose, Mouth & Throat Negative Endocrine Negative Eyes Negative Gastrointestinal Negative Genitourinary Negative Hemotologic/Lymphatic Negative Integumentary Negative Musculoskeletal Negative Neurological Negative Psychiatry Negative Respiratory  Social   Never smoked   Medication Prednisolone-Moxifloxacin-Bromfenac, Lisinopril, Lovastatin,   Sx/Procedures Phaco c IOL OD,   Drug Allergies   NKDA  History & Physical: Heent: Cataract, Left Eye NECK: supple without bruits LUNGS: lungs clear to auscultation CV: regular rate and rhythm Abdomen:  soft and non-tender  Impression & Plan: Assessment: 1.  CATARACT EXTRACTION STATUS; Right Eye (Z98.41) 2.  INTRAOCULAR LENS IOL ; Right Eye (Z96.1) 3.  COMBINED FORMS AGE RELATED CATARACT; , Left Eye (H25.812) 4.  Myopia ; Right Eye (H52.11)  Plan: 1.  1 week after cataract surgery. Doing well with improved vision and normal eye pressure. Call with any problems or concerns. Continue Pred-Moxi-Brom 2x/day for 3 more weeks.  2.  Doing well since surgery Continue Post-op medications  3.  Cataract accounts for the patient's decreased vision. This visual impairment is not correctable with a tolerable change in glasses or contact lenses. Cataract surgery with an implantation of a new lens should significantly improve the visual and functional status of the patient. Discussed all risks, benefits, alternatives, and potential complications. Discussed the procedures and recovery. Patient desires to have surgery. A-scan ordered and performed today for intra-ocular lens calculations. The surgery will be performed in order to improve vision for driving, reading, and for eye examinations. Recommend phacoemulsification with intra-ocular lens. Recommend Dextenza for post-operative pain and inflammation. Right Eye. worse - first Dilates well - shugarcaine by protocol.  4.

## 2021-09-06 ENCOUNTER — Other Ambulatory Visit: Payer: Self-pay

## 2021-09-06 ENCOUNTER — Encounter (HOSPITAL_COMMUNITY)
Admission: RE | Admit: 2021-09-06 | Discharge: 2021-09-06 | Disposition: A | Discharge status: Home / Self Care | Payer: Medicare Other | Source: Ambulatory Visit | Attending: Ophthalmology | Admitting: Ophthalmology

## 2021-09-21 ENCOUNTER — Encounter (HOSPITAL_COMMUNITY)
Admission: RE | Admit: 2021-09-21 | Discharge: 2021-09-21 | Disposition: A | Discharge status: Home / Self Care | Payer: Medicare Other | Source: Ambulatory Visit | Attending: Ophthalmology | Admitting: Ophthalmology

## 2021-09-21 ENCOUNTER — Other Ambulatory Visit: Payer: Self-pay

## 2021-09-21 ENCOUNTER — Encounter (HOSPITAL_COMMUNITY): Payer: Self-pay

## 2021-11-10 ENCOUNTER — Other Ambulatory Visit: Payer: Self-pay

## 2021-11-10 ENCOUNTER — Encounter (HOSPITAL_COMMUNITY)
Admission: RE | Admit: 2021-11-10 | Discharge: 2021-11-10 | Disposition: A | Discharge status: Home / Self Care | Payer: Medicare Other | Source: Ambulatory Visit | Attending: Ophthalmology | Admitting: Ophthalmology

## 2021-11-10 ENCOUNTER — Encounter (HOSPITAL_COMMUNITY): Payer: Self-pay

## 2021-11-18 NOTE — H&P (Signed)
Surgical History & Physical  Patient Name: Jessica Russo DOB: Dec 01, 1955  Surgery: Cataract extraction with intraocular lens implant phacoemulsification; Left Eye  Surgeon: Baruch Goldmann MD Surgery Date:  11-19-21 Pre-Op Date:  10-21-21  HPI: A 41 Yr. old female patient 1. The patient is returning after cataract post-op (late for 4 weeks postop OD). Right eye is affected. Status post cataract post-op, 2 months ago: Since the last visit, the affected area has no changes noted. The patient's vision is stable. Patient has finished meds as instructed. 2. 2. The patient complains of difficulty when driving, which began 1 year ago. The left eye is affected. The episode is gradual. The condition's severity increased since last visit. Symptoms occur when the patient is driving and outside. This is negatively affecting the patient's quality of life and the patient is unable to function adequately in life with the current level of vision. HPI was performed by Baruch Goldmann .  Medical History: Demyelinating Optic Neuropathy, Hypertensive Optic Neuropathy, Ocular ischemia Syndrome  Hyperopia, Astigmatism, Presbyopia Elevated Cholesterol, High Blood Pressure  Review of Systems Negative Allergic/Immunologic Negative Cardiovascular Negative Constitutional Negative Ear, Nose, Mouth & Throat Negative Endocrine Negative Eyes Negative Gastrointestinal Negative Genitourinary Negative Hemotologic/Lymphatic Negative Integumentary Negative Musculoskeletal Negative Neurological Negative Psychiatry Negative Respiratory  Social   Never smoked   Medication Prednisolone-Moxifloxacin-Bromfenac, Lisinopril, Lovastatin,   Sx/Procedures Phaco c IOL OD,   Drug Allergies   NKDA  History & Physical: Heent: Cataract, Left Eye NECK: supple without bruits LUNGS: lungs clear to auscultation CV: regular rate and rhythm Abdomen: soft and non-tender  Impression & Plan: Assessment: 1.  CATARACT EXTRACTION  STATUS; Right Eye (Z98.41) 2.  INTRAOCULAR LENS IOL ; Right Eye (Z96.1) 3.  COMBINED FORMS AGE RELATED CATARACT; , Left Eye (H25.812)  Plan: 1.  3 months after surgery.  2.  Doing well since surgery Stable.  3.  Dilates well - shugarcaine by protocol. Cataract accounts for the patient's decreased vision. This visual impairment is not correctable with a tolerable change in glasses or contact lenses. Cataract surgery with an implantation of a new lens should significantly improve the visual and functional status of the patient. Discussed all risks, benefits, alternatives, and potential complications. Discussed the procedures and recovery. Patient desires to have surgery. A-scan ordered and performed today for intra-ocular lens calculations. The surgery will be performed in order to improve vision for driving, reading, and for eye examinations. Recommend phacoemulsification with intra-ocular lens. Recommend Dextenza for post-operative pain and inflammation. Left Eye. Surgery required to correct imbalance of vision.

## 2021-11-19 ENCOUNTER — Ambulatory Visit (HOSPITAL_COMMUNITY): Payer: Medicare Other | Admitting: Certified Registered"

## 2021-11-19 ENCOUNTER — Encounter (HOSPITAL_COMMUNITY): Payer: Self-pay | Admitting: Ophthalmology

## 2021-11-19 ENCOUNTER — Ambulatory Visit (HOSPITAL_COMMUNITY)
Admission: RE | Admit: 2021-11-19 | Discharge: 2021-11-19 | Disposition: A | Discharge status: Home / Self Care | Payer: Medicare Other | Attending: Ophthalmology | Admitting: Ophthalmology

## 2021-11-19 ENCOUNTER — Encounter (HOSPITAL_COMMUNITY)
Admission: RE | Disposition: A | Discharge status: Home / Self Care | Payer: Self-pay | Source: Home / Self Care | Attending: Ophthalmology

## 2021-11-19 ENCOUNTER — Other Ambulatory Visit: Payer: Self-pay

## 2021-11-19 DIAGNOSIS — H25812 Combined forms of age-related cataract, left eye: Secondary | ICD-10-CM | POA: Insufficient documentation

## 2021-11-19 DIAGNOSIS — I10 Essential (primary) hypertension: Secondary | ICD-10-CM | POA: Insufficient documentation

## 2021-11-19 HISTORY — PX: CATARACT EXTRACTION W/PHACO: SHX586

## 2021-11-19 SURGERY — PHACOEMULSIFICATION, CATARACT, WITH IOL INSERTION
Anesthesia: Monitor Anesthesia Care | Site: Eye | Laterality: Left

## 2021-11-19 MED ORDER — SODIUM HYALURONATE 23MG/ML IO SOSY
PREFILLED_SYRINGE | INTRAOCULAR | Status: DC | PRN
  Administered 2021-11-19: 0.6 mL via INTRAOCULAR

## 2021-11-19 MED ORDER — MIDAZOLAM HCL 2 MG/2ML IJ SOLN
INTRAMUSCULAR | Status: AC
  Filled 2021-11-19: qty 2

## 2021-11-19 MED ORDER — TROPICAMIDE 1 % OP SOLN
1.0000 [drp] | OPHTHALMIC | Status: AC | PRN
  Administered 2021-11-19 (×3): 1 [drp] via OPHTHALMIC

## 2021-11-19 MED ORDER — SODIUM HYALURONATE 10 MG/ML IO SOLUTION
PREFILLED_SYRINGE | INTRAOCULAR | Status: DC | PRN
  Administered 2021-11-19: 0.85 mL via INTRAOCULAR

## 2021-11-19 MED ORDER — SODIUM CHLORIDE 0.9% FLUSH
INTRAVENOUS | Status: DC | PRN
  Administered 2021-11-19: 5 mL via INTRAVENOUS

## 2021-11-19 MED ORDER — LIDOCAINE HCL (PF) 1 % IJ SOLN
INTRAOCULAR | Status: DC | PRN
  Administered 2021-11-19: 1 mL via OPHTHALMIC

## 2021-11-19 MED ORDER — STERILE WATER FOR IRRIGATION IR SOLN
Status: DC | PRN
  Administered 2021-11-19: 250 mL

## 2021-11-19 MED ORDER — TETRACAINE HCL 0.5 % OP SOLN
1.0000 [drp] | OPHTHALMIC | Status: AC | PRN
  Administered 2021-11-19 (×3): 1 [drp] via OPHTHALMIC

## 2021-11-19 MED ORDER — POVIDONE-IODINE 5 % OP SOLN
OPHTHALMIC | Status: DC | PRN
  Administered 2021-11-19: 1 via OPHTHALMIC

## 2021-11-19 MED ORDER — PHENYLEPHRINE HCL 2.5 % OP SOLN
1.0000 [drp] | OPHTHALMIC | Status: AC | PRN
  Administered 2021-11-19 (×3): 1 [drp] via OPHTHALMIC

## 2021-11-19 MED ORDER — NEOMYCIN-POLYMYXIN-DEXAMETH 3.5-10000-0.1 OP SUSP
OPHTHALMIC | Status: DC | PRN
  Administered 2021-11-19: 1 [drp] via OPHTHALMIC

## 2021-11-19 MED ORDER — EPINEPHRINE PF 1 MG/ML IJ SOLN
INTRAOCULAR | Status: DC | PRN
  Administered 2021-11-19: 500 mL

## 2021-11-19 MED ORDER — BSS IO SOLN
INTRAOCULAR | Status: DC | PRN
  Administered 2021-11-19: 15 mL via INTRAOCULAR

## 2021-11-19 MED ORDER — MIDAZOLAM HCL 2 MG/2ML IJ SOLN
INTRAMUSCULAR | Status: DC | PRN
  Administered 2021-11-19: 1 mg via INTRAVENOUS

## 2021-11-19 MED ORDER — EPINEPHRINE PF 1 MG/ML IJ SOLN
INTRAMUSCULAR | Status: AC
  Filled 2021-11-19: qty 2

## 2021-11-19 MED ORDER — LIDOCAINE HCL 3.5 % OP GEL
1.0000 "application " | Freq: Once | OPHTHALMIC | Status: AC
  Administered 2021-11-19: 1 via OPHTHALMIC

## 2021-11-19 SURGICAL SUPPLY — 14 items
CATARACT SUITE SIGHTPATH (MISCELLANEOUS) ×2 IMPLANT
CLOTH BEACON ORANGE TIMEOUT ST (SAFETY) ×1 IMPLANT
EYE SHIELD UNIVERSAL CLEAR (GAUZE/BANDAGES/DRESSINGS) ×1 IMPLANT
FEE CATARACT SUITE SIGHTPATH (MISCELLANEOUS) IMPLANT
GLOVE SURG UNDER POLY LF SZ7 (GLOVE) ×2 IMPLANT
LENS IOL RAYNER 23.5 (Intraocular Lens) ×2 IMPLANT
LENS IOL RAYONE EMV 23.5 (Intraocular Lens) IMPLANT
NDL HYPO 18GX1.5 BLUNT FILL (NEEDLE) IMPLANT
NEEDLE HYPO 18GX1.5 BLUNT FILL (NEEDLE) ×2 IMPLANT
PAD ARMBOARD 7.5X6 YLW CONV (MISCELLANEOUS) ×1 IMPLANT
SYR TB 1ML LL NO SAFETY (SYRINGE) ×1 IMPLANT
TAPE SURG TRANSPORE 1 IN (GAUZE/BANDAGES/DRESSINGS) IMPLANT
TAPE SURGICAL TRANSPORE 1 IN (GAUZE/BANDAGES/DRESSINGS) ×1
WATER STERILE IRR 250ML POUR (IV SOLUTION) ×1 IMPLANT

## 2021-11-19 NOTE — Op Note (Signed)
Date of procedure: 11/19/21  Pre-operative diagnosis: Visually significant age-related combined cataract, Left Eye (H25.812)  Post-operative diagnosis: Visually significant age-related combined cataract, Left Eye (H25.812)  Procedure: Removal of cataract via phacoemulsification and insertion of intra-ocular lens Rayner [id number] +23.5D into the capsular bag of the Left Eye  Attending surgeon: Gerda Diss. Codi Kertz, MD, MA  Anesthesia: MAC, Topical Akten  Complications: None  Estimated Blood Loss: <13m (minimal)  Specimens: None  Implants: As above  Indications:  Visually significant age-related cataract, Left Eye  Procedure:  The patient was seen and identified in the pre-operative area. The operative eye was identified and dilated.  The operative eye was marked.  Topical anesthesia was administered to the operative eye.     The patient was then to the operative suite and placed in the supine position.  A timeout was performed confirming the patient, procedure to be performed, and all other relevant information.   The patient's face was prepped and draped in the usual fashion for intra-ocular surgery.  A lid speculum was placed into the operative eye and the surgical microscope moved into place and focused.  An inferotemporal paracentesis was created using a 20 gauge paracentesis blade.  Shugarcaine was injected into the anterior chamber.  Viscoelastic was injected into the anterior chamber.  A temporal clear-corneal main wound incision was created using a 2.455mmicrokeratome.  A continuous curvilinear capsulorrhexis was initiated using an irrigating cystitome and completed using capsulorrhexis forceps.  Hydrodissection and hydrodeliniation were performed.  Viscoelastic was injected into the anterior chamber.  A phacoemulsification handpiece and a chopper as a second instrument were used to remove the nucleus and epinucleus. The irrigation/aspiration handpiece was used to remove any remaining  cortical material.   The capsular bag was reinflated with viscoelastic, checked, and found to be intact.  The intraocular lens was inserted into the capsular bag.  The irrigation/aspiration handpiece was used to remove any remaining viscoelastic.  The clear corneal wound and paracentesis wounds were then hydrated and checked with Weck-Cels to be watertight.  The lid-speculum was removed.  The drape was removed.  The patient's face was cleaned with a wet and dry 4x4.   Maxitrol was instilled in the eye. A clear shield was taped over the eye. The patient was taken to the post-operative care unit in good condition, having tolerated the procedure well.  Post-Op Instructions: [facility] and will receive all other orders and instructions.

## 2021-11-19 NOTE — Anesthesia Preprocedure Evaluation (Signed)
Anesthesia Evaluation  Patient identified by MRN, date of birth, ID band Patient awake    Reviewed: Allergy & Precautions, NPO status , Patient's Chart, lab work & pertinent test results  Airway Mallampati: I  TM Distance: >3 FB Neck ROM: Full   Comment: Neck pain, back pain Dental  (+) Dental Advisory Given, Teeth Intact   Pulmonary neg pulmonary ROS,    Pulmonary exam normal breath sounds clear to auscultation       Cardiovascular Exercise Tolerance: Good hypertension, Pt. on medications Normal cardiovascular exam Rhythm:Regular Rate:Normal     Neuro/Psych negative neurological ROS  negative psych ROS   GI/Hepatic negative GI ROS, Neg liver ROS,   Endo/Other  negative endocrine ROS  Renal/GU negative Renal ROS     Musculoskeletal  (+) Arthritis ,   Abdominal   Peds  Hematology negative hematology ROS (+)   Anesthesia Other Findings Neck pain, back pain  Reproductive/Obstetrics                            Anesthesia Physical Anesthesia Plan  ASA: 2  Anesthesia Plan: MAC   Post-op Pain Management:    Induction:   PONV Risk Score and Plan:   Airway Management Planned: Nasal Cannula and Natural Airway  Additional Equipment:   Intra-op Plan:   Post-operative Plan:   Informed Consent: I have reviewed the patients History and Physical, chart, labs and discussed the procedure including the risks, benefits and alternatives for the proposed anesthesia with the patient or authorized representative who has indicated his/her understanding and acceptance.     Dental advisory given  Plan Discussed with: CRNA and Surgeon  Anesthesia Plan Comments:         Anesthesia Quick Evaluation  

## 2021-11-19 NOTE — Anesthesia Procedure Notes (Signed)
Procedure Name: MAC Date/Time: 11/19/2021 11:21 AM Performed by: Orlie Dakin, CRNA Pre-anesthesia Checklist: Patient identified, Emergency Drugs available, Suction available and Patient being monitored Patient Re-evaluated:Patient Re-evaluated prior to induction Oxygen Delivery Method: Nasal cannula Placement Confirmation: positive ETCO2

## 2021-11-19 NOTE — Anesthesia Postprocedure Evaluation (Signed)
Anesthesia Post Note  Patient: Jessica Russo  Procedure(s) Performed: CATARACT EXTRACTION PHACO AND INTRAOCULAR LENS PLACEMENT (IOC) (Left: Eye)  Patient location during evaluation: Phase II Anesthesia Type: MAC Level of consciousness: awake Pain management: pain level controlled Vital Signs Assessment: post-procedure vital signs reviewed and stable Respiratory status: spontaneous breathing and respiratory function stable Cardiovascular status: blood pressure returned to baseline and stable Postop Assessment: no headache and no apparent nausea or vomiting Anesthetic complications: no Comments: Late entry   No notable events documented.   Last Vitals:  Vitals:   11/19/21 1030 11/19/21 1134  BP: 124/79 122/84  Pulse: 79 73  Resp: 16 16  Temp: 36.4 C 36.6 C  SpO2: 98% 97%    Last Pain:  Vitals:   11/19/21 1134  TempSrc: Oral  PainSc: 0-No pain                 Louann Sjogren

## 2021-11-19 NOTE — Transfer of Care (Signed)
Immediate Anesthesia Transfer of Care Note  Patient: Jessica Russo  Procedure(s) Performed: CATARACT EXTRACTION PHACO AND INTRAOCULAR LENS PLACEMENT (IOC) (Left: Eye)  Patient Location: Short Stay  Anesthesia Type:MAC  Level of Consciousness: awake, alert  and oriented  Airway & Oxygen Therapy: Patient Spontanous Breathing  Post-op Assessment: Report given to RN, Post -op Vital signs reviewed and stable and Patient moving all extremities X 4  Post vital signs: Reviewed and stable  Last Vitals:  Vitals Value Taken Time  BP    Temp    Pulse    Resp    SpO2      Last Pain:  Vitals:   11/19/21 1030  TempSrc: Oral  PainSc: 8          Complications: No notable events documented.

## 2021-11-19 NOTE — Discharge Instructions (Signed)
Please discharge patient when stable, will follow up today with Dr. Tandrea Kommer at the [facility] [place] office immediately following discharge.  Leave shield in place until visit.  All paperwork with discharge instructions will be given at the office.   [facility] Address:  [address]  [place], [place] [address]  

## 2021-11-19 NOTE — Interval H&P Note (Signed)
History and Physical Interval Note:  11/19/2021 11:32 AM  Jessica Russo  has presented today for surgery, with the diagnosis of Nuclear sclerotic cataract - Left eye.  The various methods of treatment have been discussed with the patient and family. After consideration of risks, benefits and other options for treatment, the patient has consented to  Procedure(s) with comments: CATARACT EXTRACTION PHACO AND INTRAOCULAR LENS PLACEMENT (IOC) (Left) - CDE: 6.56 as a surgical intervention.  The patient's history has been reviewed, patient examined, no change in status, stable for surgery.  I have reviewed the patient's chart and labs.  Questions were answered to the patient's satisfaction.     Fabio Pierce

## 2021-11-19 NOTE — Interval H&P Note (Signed)
History and Physical Interval Note:  11/19/2021 11:12 AM  Jessica Russo  has presented today for surgery, with the diagnosis of Nuclear sclerotic cataract - Left eye.  The various methods of treatment have been discussed with the patient and family. After consideration of risks, benefits and other options for treatment, the patient has consented to  Procedure(s) with comments: CATARACT EXTRACTION PHACO AND INTRAOCULAR LENS PLACEMENT ([place]) (Left) - left as a surgical intervention.  The patient's history has been reviewed, patient examined, no change in status, stable for surgery.  I have reviewed the patient's chart and labs.  Questions were answered to the patient's satisfaction.     Baruch Goldmann

## 2021-11-23 ENCOUNTER — Encounter (HOSPITAL_COMMUNITY): Payer: Self-pay | Admitting: Ophthalmology

## 2024-02-01 DIAGNOSIS — J40 Bronchitis, not specified as acute or chronic: Secondary | ICD-10-CM | POA: Diagnosis not present

## 2024-02-01 DIAGNOSIS — R6889 Other general symptoms and signs: Secondary | ICD-10-CM | POA: Diagnosis not present

## 2024-02-01 DIAGNOSIS — Z0130 Encounter for examination of blood pressure without abnormal findings: Secondary | ICD-10-CM | POA: Diagnosis not present

## 2024-02-24 ENCOUNTER — Emergency Department (HOSPITAL_COMMUNITY)

## 2024-02-24 ENCOUNTER — Encounter (HOSPITAL_COMMUNITY): Payer: Self-pay | Admitting: *Deleted

## 2024-02-24 ENCOUNTER — Emergency Department (HOSPITAL_COMMUNITY)
Admission: EM | Admit: 2024-02-24 | Discharge: 2024-02-24 | Disposition: A | Discharge status: Home / Self Care | Attending: Emergency Medicine | Admitting: Emergency Medicine

## 2024-02-24 ENCOUNTER — Other Ambulatory Visit: Payer: Self-pay

## 2024-02-24 DIAGNOSIS — R051 Acute cough: Secondary | ICD-10-CM | POA: Diagnosis not present

## 2024-02-24 DIAGNOSIS — J42 Unspecified chronic bronchitis: Secondary | ICD-10-CM | POA: Diagnosis not present

## 2024-02-24 DIAGNOSIS — R0602 Shortness of breath: Secondary | ICD-10-CM | POA: Insufficient documentation

## 2024-02-24 LAB — RESP PANEL BY RT-PCR (RSV, FLU A&B, COVID)  RVPGX2
Influenza A by PCR: NEGATIVE
Influenza B by PCR: NEGATIVE
Resp Syncytial Virus by PCR: NEGATIVE
SARS Coronavirus 2 by RT PCR: NEGATIVE

## 2024-02-24 MED ORDER — PREDNISONE 50 MG PO TABS
60.0000 mg | ORAL_TABLET | Freq: Once | ORAL | Status: AC
  Administered 2024-02-24: 60 mg via ORAL
  Filled 2024-02-24: qty 1

## 2024-02-24 MED ORDER — ALBUTEROL SULFATE HFA 108 (90 BASE) MCG/ACT IN AERS
2.0000 | INHALATION_SPRAY | Freq: Once | RESPIRATORY_TRACT | Status: AC
Start: 2024-02-24 — End: 2024-02-24
  Administered 2024-02-24: 2 via RESPIRATORY_TRACT
  Filled 2024-02-24: qty 6.7

## 2024-02-24 MED ORDER — PREDNISONE 20 MG PO TABS: ORAL_TABLET | ORAL | 0 refills | Status: AC

## 2024-02-24 MED ORDER — DOXYCYCLINE HYCLATE 100 MG PO TABS
100.0000 mg | ORAL_TABLET | Freq: Once | ORAL | Status: AC
  Administered 2024-02-24: 100 mg via ORAL
  Filled 2024-02-24: qty 1

## 2024-02-24 MED ORDER — DOXYCYCLINE HYCLATE 100 MG PO CAPS: 100.0000 mg | ORAL_CAPSULE | Freq: Two times a day (BID) | ORAL | 0 refills | Status: AC

## 2024-02-24 NOTE — ED Notes (Signed)
 Pt ambulated to ED room. A&Ox4. Pt stated, "I have this bad cough. My throat doesn't hurt but I am very congested."  Pt denies being SOB at this time. Pt does have a non productive cough at this time. Oxygen saturation is above 94% on RA.

## 2024-02-24 NOTE — ED Triage Notes (Signed)
 Pt with recent URI 2 weeks ago and took an antibiotic which helped some.  Pt states she never got over it completely. Productive cough- brown in color.

## 2024-02-24 NOTE — Discharge Instructions (Signed)
 Use the inhaler every 4-6 hours for shortness of breath or cough.  Follow-up with your family doctor this week for recheck

## 2024-02-24 NOTE — ED Provider Notes (Signed)
 [place] EMERGENCY DEPARTMENT AT [facility] Provider Note   CSN: [id number] Arrival date & time: 02/24/24  1449     History {Add pertinent medical, surgical, social history, OB history to HPI:1} Chief Complaint  Patient presents with   Cough    Jessica Russo is a 68 y.o. female.  Patient with a cough and shortness of breath for the last couple weeks.  Patient has a history of hyperlipidemia.  Patient has taken a course of Z-Pak without help   Cough      Home Medications Prior to Admission medications   Medication Sig Start Date End Date Taking? Authorizing Provider  doxycycline (VIBRAMYCIN) 100 MG capsule Take 1 capsule (100 mg total) by mouth 2 (two) times daily. One po bid x 7 days 02/24/24  Yes Janasia Coverdale, MD  predniSONE (DELTASONE) 20 MG tablet 2 tabs po daily x 3 days 02/24/24  Yes Eaton Folmar, MD  benzonatate (TESSALON PERLES) 100 MG capsule Take 2 capsules (200 mg total) by mouth 3 (three) times daily as needed for cough. 01/08/20   Nellene Banana, MD  fluticasone (FLONASE) 50 MCG/ACT nasal spray Place 2 sprays into both nostrils daily for 7 days. Patient not taking: No sig reported 12/29/19 07/07/21  Long, Joshua G, MD  ibuprofen (ADVIL) 200 MG tablet Take 600 mg by mouth every 6 (six) hours as needed for mild pain or moderate pain.    [provider]  lisinopril (ZESTRIL) 10 MG tablet Take 10 mg by mouth daily.    [provider]  lovastatin (MEVACOR) 10 MG tablet Take 10 mg by mouth at bedtime.    [provider]  methocarbamol (ROBAXIN) 500 MG tablet Take 1 tablet (500 mg total) by mouth 4 (four) times daily. 08/13/20   Sofia, Leslie K, PA-C      Allergies    Patient has no known allergies.    Review of Systems   Review of Systems  Respiratory:  Positive for cough.     Physical Exam Updated Vital Signs BP (!) 129/94 (BP Location: Right Arm)   Pulse 77   Temp 98.2 F (36.8 C) (Oral)   Resp 17   Ht 5\' 4"  (1.626 m)    Wt 59.9 kg   SpO2 97%   BMI 22.66 kg/m  Physical Exam  ED Results / Procedures / Treatments   Labs (all labs ordered are listed, but only abnormal results are displayed) Labs Reviewed  RESP PANEL BY RT-PCR (RSV, FLU A&B, COVID)  [id number]    EKG None  Radiology DG Chest 2 View Result Date: 02/24/2024 CLINICAL DATA:  Short of breath EXAM: CHEST - 2 VIEW COMPARISON:  None Available. FINDINGS: Normal mediastinum and cardiac silhouette. Chronic central bronchitic markings. Normal pulmonary vasculature. No effusion, infiltrate, or pneumothorax. IMPRESSION: Chronic bronchitic markings. No acute cardiopulmonary process. Electronically Signed   By: Deboraha Fallow M.D.   On: 02/24/2024 15:36    Procedures Procedures  {Document cardiac monitor, telemetry assessment procedure when appropriate:1}  Medications Ordered in ED Medications  predniSONE (DELTASONE) tablet 60 mg (has no administration in time range)  albuterol (VENTOLIN HFA) 108 (90 Base) MCG/ACT inhaler 2 puff (has no administration in time range)  doxycycline (VIBRA-TABS) tablet 100 mg (has no administration in time range)    ED Course/ Medical Decision Making/ A&P   {   Click here for ABCD2, HEART and other calculatorsREFRESH Note before signing :1}  Medical Decision Making Amount and/or Complexity of Data Reviewed Radiology: ordered.  Risk Prescription drug management.    {Document critical care time when appropriate:1} {Document review of labs and clinical decision tools ie heart score, Chads2Vasc2 etc:1}  {Document your independent review of radiology images, and any outside records:1} {Document your discussion with family members, caretakers, and with consultants:1} {Document social determinants of health affecting pt's care:1} {Document your decision making why or why not admission, treatments were needed:1} Final Clinical Impression(s) / ED Diagnoses Final diagnoses:  Acute  cough   Patient with bronchitis and bronchospasm.  She will be placed on doxycycline, albuterol inhaler and prednisone and follow-up with PCP Rx / DC Orders ED Discharge Orders          Ordered    doxycycline (VIBRAMYCIN) 100 MG capsule  2 times daily        02/24/24 1650    predniSONE (DELTASONE) 20 MG tablet        02/24/24 1650

## 2024-03-26 DIAGNOSIS — Z0130 Encounter for examination of blood pressure without abnormal findings: Secondary | ICD-10-CM | POA: Diagnosis not present

## 2024-03-26 DIAGNOSIS — K047 Periapical abscess without sinus: Secondary | ICD-10-CM | POA: Diagnosis not present
